# Patient Record
Sex: Male | Born: 1970 | Race: White | Hispanic: No | Marital: Married | Smoking: Former smoker
Health system: Southern US, Community
[De-identification: ages and names within clinical notes are randomized; demographics above are authoritative.]

## PROBLEM LIST (undated history)

## (undated) DIAGNOSIS — R569 Unspecified convulsions: Secondary | ICD-10-CM

## (undated) DIAGNOSIS — T7840XA Allergy, unspecified, initial encounter: Secondary | ICD-10-CM

## (undated) DIAGNOSIS — K760 Fatty (change of) liver, not elsewhere classified: Secondary | ICD-10-CM

## (undated) DIAGNOSIS — B019 Varicella without complication: Secondary | ICD-10-CM

## (undated) DIAGNOSIS — F1010 Alcohol abuse, uncomplicated: Secondary | ICD-10-CM

## (undated) DIAGNOSIS — M4802 Spinal stenosis, cervical region: Secondary | ICD-10-CM

## (undated) DIAGNOSIS — D1803 Hemangioma of intra-abdominal structures: Secondary | ICD-10-CM

## (undated) DIAGNOSIS — M4310 Spondylolisthesis, site unspecified: Secondary | ICD-10-CM

## (undated) HISTORY — DX: Alcohol abuse, uncomplicated: F10.10

## (undated) HISTORY — DX: Hemangioma of intra-abdominal structures: D18.03

## (undated) HISTORY — DX: Unspecified convulsions: R56.9

## (undated) HISTORY — DX: Fatty (change of) liver, not elsewhere classified: K76.0

## (undated) HISTORY — DX: Spinal stenosis, cervical region: M48.02

## (undated) HISTORY — DX: Spondylolisthesis, site unspecified: M43.10

## (undated) HISTORY — PX: HERNIA REPAIR: SHX51

## (undated) HISTORY — DX: Allergy, unspecified, initial encounter: T78.40XA

## (undated) HISTORY — DX: Varicella without complication: B01.9

---
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
## 2006-02-20 ENCOUNTER — Emergency Department: Payer: Self-pay | Admitting: Emergency Medicine

## 2009-03-24 ENCOUNTER — Ambulatory Visit: Payer: Self-pay | Admitting: Internal Medicine

## 2009-04-12 ENCOUNTER — Ambulatory Visit: Payer: Self-pay | Admitting: Internal Medicine

## 2009-10-13 ENCOUNTER — Ambulatory Visit: Payer: Self-pay | Admitting: Internal Medicine

## 2009-11-01 ENCOUNTER — Ambulatory Visit: Payer: Self-pay | Admitting: Internal Medicine

## 2010-03-29 ENCOUNTER — Ambulatory Visit: Payer: Self-pay | Admitting: Gastroenterology

## 2010-05-16 ENCOUNTER — Encounter: Admission: RE | Admit: 2010-05-16 | Discharge: 2010-05-16 | Payer: Self-pay | Admitting: Gastroenterology

## 2010-12-06 ENCOUNTER — Ambulatory Visit: Payer: Self-pay | Admitting: Gastroenterology

## 2011-08-11 ENCOUNTER — Emergency Department: Payer: Self-pay | Admitting: Emergency Medicine

## 2011-09-07 ENCOUNTER — Other Ambulatory Visit: Payer: Self-pay | Admitting: Unknown Physician Specialty

## 2011-09-07 DIAGNOSIS — M25511 Pain in right shoulder: Secondary | ICD-10-CM

## 2011-09-14 ENCOUNTER — Ambulatory Visit
Admission: RE | Admit: 2011-09-14 | Discharge: 2011-09-14 | Disposition: A | Discharge status: Home / Self Care | Payer: BC Managed Care – PPO | Source: Ambulatory Visit | Attending: Unknown Physician Specialty | Admitting: Unknown Physician Specialty

## 2011-09-14 DIAGNOSIS — M25511 Pain in right shoulder: Secondary | ICD-10-CM

## 2011-09-14 MED ORDER — IOHEXOL 180 MG/ML  SOLN
8.0000 mL | Freq: Once | INTRAMUSCULAR | Status: AC | PRN
  Administered 2011-09-14: 8 mL via INTRA_ARTICULAR

## 2011-10-02 HISTORY — PX: SHOULDER SURGERY: SHX246

## 2012-02-06 ENCOUNTER — Emergency Department: Payer: Self-pay | Admitting: *Deleted

## 2014-04-27 ENCOUNTER — Ambulatory Visit: Payer: Self-pay | Admitting: Surgery

## 2014-05-02 ENCOUNTER — Emergency Department: Payer: Self-pay | Admitting: Emergency Medicine

## 2014-05-02 LAB — COMPREHENSIVE METABOLIC PANEL
ALBUMIN: 4.8 g/dL (ref 3.4–5.0)
ALK PHOS: 69 U/L
ANION GAP: 7 (ref 7–16)
BUN: 9 mg/dL (ref 7–18)
Bilirubin,Total: 0.5 mg/dL (ref 0.2–1.0)
CO2: 27 mmol/L (ref 21–32)
Calcium, Total: 9.5 mg/dL (ref 8.5–10.1)
Chloride: 103 mmol/L (ref 98–107)
Creatinine: 0.86 mg/dL (ref 0.60–1.30)
GLUCOSE: 84 mg/dL (ref 65–99)
Osmolality: 272 (ref 275–301)
POTASSIUM: 3.8 mmol/L (ref 3.5–5.1)
SGOT(AST): 27 U/L (ref 15–37)
SGPT (ALT): 35 U/L
Sodium: 137 mmol/L (ref 136–145)
TOTAL PROTEIN: 9.2 g/dL — AB (ref 6.4–8.2)

## 2014-05-02 LAB — CBC WITH DIFFERENTIAL/PLATELET
BASOS PCT: 0.7 %
Basophil #: 0.1 10*3/uL (ref 0.0–0.1)
EOS ABS: 0.2 10*3/uL (ref 0.0–0.7)
EOS PCT: 1.8 %
HCT: 44.1 % (ref 40.0–52.0)
HGB: 14.8 g/dL (ref 13.0–18.0)
LYMPHS ABS: 3.1 10*3/uL (ref 1.0–3.6)
Lymphocyte %: 33.6 %
MCH: 31 pg (ref 26.0–34.0)
MCHC: 33.5 g/dL (ref 32.0–36.0)
MCV: 93 fL (ref 80–100)
MONOS PCT: 5.6 %
Monocyte #: 0.5 x10 3/mm (ref 0.2–1.0)
NEUTROS ABS: 5.3 10*3/uL (ref 1.4–6.5)
NEUTROS PCT: 58.3 %
PLATELETS: 333 10*3/uL (ref 150–440)
RBC: 4.76 10*6/uL (ref 4.40–5.90)
RDW: 12 % (ref 11.5–14.5)
WBC: 9.1 10*3/uL (ref 3.8–10.6)

## 2014-05-02 LAB — URINALYSIS, COMPLETE
BACTERIA: NONE SEEN
BILIRUBIN, UR: NEGATIVE
Blood: NEGATIVE
GLUCOSE, UR: NEGATIVE mg/dL (ref 0–75)
Ketone: NEGATIVE
LEUKOCYTE ESTERASE: NEGATIVE
Nitrite: NEGATIVE
Ph: 6 (ref 4.5–8.0)
Protein: NEGATIVE
RBC, UR: NONE SEEN /HPF (ref 0–5)
SQUAMOUS EPITHELIAL: NONE SEEN
Specific Gravity: 1.005 (ref 1.003–1.030)
WBC UR: <1 /HPF (ref 0–5)

## 2014-05-21 LAB — HM COLONOSCOPY

## 2014-06-10 DIAGNOSIS — K219 Gastro-esophageal reflux disease without esophagitis: Secondary | ICD-10-CM | POA: Insufficient documentation

## 2014-06-10 DIAGNOSIS — J309 Allergic rhinitis, unspecified: Secondary | ICD-10-CM | POA: Insufficient documentation

## 2014-06-10 DIAGNOSIS — M4802 Spinal stenosis, cervical region: Secondary | ICD-10-CM | POA: Insufficient documentation

## 2014-06-10 DIAGNOSIS — K760 Fatty (change of) liver, not elsewhere classified: Secondary | ICD-10-CM | POA: Insufficient documentation

## 2014-06-18 ENCOUNTER — Ambulatory Visit: Payer: Self-pay | Admitting: Gastroenterology

## 2014-06-22 ENCOUNTER — Ambulatory Visit: Payer: Self-pay | Admitting: Gastroenterology

## 2014-07-16 ENCOUNTER — Ambulatory Visit: Payer: Self-pay | Admitting: Urology

## 2015-01-22 NOTE — Op Note (Signed)
PATIENT NAME:  Gary Mccullough, Gary Mccullough MR#:  [id number] DATE OF BIRTH:  [birth year]  DATE OF PROCEDURE:  04/27/2014  PREOPERATIVE DIAGNOSIS: Right inguinal hernia.   POSTOPERATIVE DIAGNOSIS: Right inguinal hernia.   PROCEDURE: Right inguinal hernia repair.   SURGEON: [name], M.D.   ANESTHESIA: General.   INDICATIONS: This 44 year old male has had bulging in the right groin. A right inguinal hernia was demonstrated on physical exam and repair was recommended for definitive treatment.   DESCRIPTION OF PROCEDURE: The patient was placed on the operating table in the supine position under general anesthesia. The right lower abdomen was prepared with clippers and with ChloraPrep and draped in a sterile manner.   A right lower quadrant transversely oriented suprapubic incision was made, carried down through subcutaneous tissues. A number of small bleeding points were electrocauterized. Scarpa's fascia was incised. The external oblique aponeurosis was incised along the course of its fibers to open the external ring and expose the inguinal cord structures. The cord structures were mobilized. The floor of the inguinal canal appeared to be intact. Cremaster fibers were spread to expose an indirect hernia sac which was approximately 4 cm in length, was dissected up into the internal ring. A high ligation of the sac was done with a 4-0 Vicryl suture ligature. Two cord lipomas were also dissected free from surrounding structures and followed up into the internal ring. Each were ligated with 4-0 chromic suture ligatures and excised. No tissues were submitted for pathology.   Next, the floor of the inguinal canal was imbricated with interrupted 0 Surgilon sutures, suturing the shelving edge of the inguinal ligament to the conjoined tendon and the last stitch led to satisfactory narrowing of the internal ring. The Bard soft mesh was cut to create an oval shape of some 2.2 x 3.5 cm with a notch cut out to straddle the  internal ring. This was placed along the floor of the inguinal canal and sutured to the repair with 0 Surgilon. It was also sutured medially to the fascia with 0 Surgilon and also sutured on both sides of the internal ring. The repair looked good.   Hemostasis was intact. The cord structures were replaced along the floor of the inguinal canal. Cut edges of the external oblique aponeurosis were closed with a running 4-0 Vicryl. The deep fascia superior and lateral to the repair site was infiltrated with 0.5% Sensorcaine with epinephrine. Subcutaneous tissues were infiltrated as well. Next, the Scarpa's fascia was closed with interrupted 4-0 Monocryl. The skin was closed with running 4-0 Monocryl subcuticular suture and LiquiBand. The testicle remained in the scrotum.   The patient appeared to tolerate the procedure satisfactorily and was prepared for transfer to the recovery room.    ________________________ Lenna Sciara. [name], MD [id number] D: 04/27/2014 15:18:00 ET T: 04/27/2014 16:39:58 ET JOB#: [id number]  cc: Loreli Dollar, MD, <Dictator> Loreli Dollar MD ELECTRONICALLY SIGNED 04/28/2014 17:44

## 2015-07-26 IMAGING — CR DG ABDOMEN 3V
2 series · 4 of 4 positions shown · non-contrast
Comparison: None.

CLINICAL DATA: Constipation following a hernia repair 5 days ago.

EXAM:
ABDOMEN SERIES

[ap]
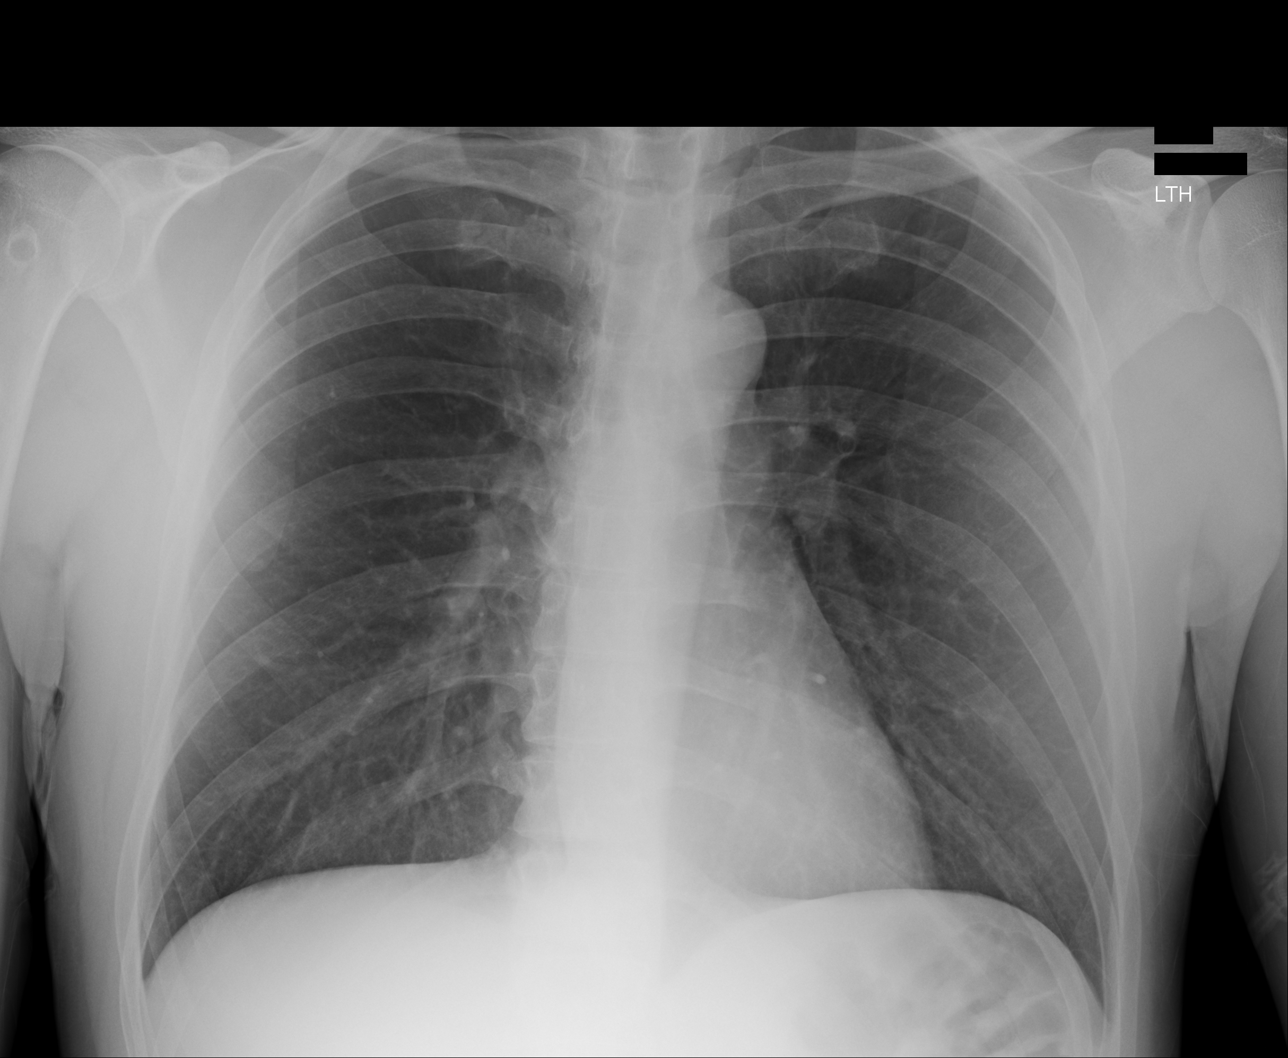

[Series 1: t abdomen supine · 0.14mm/px · 3 of 3 slices shown]
[im 1/3]
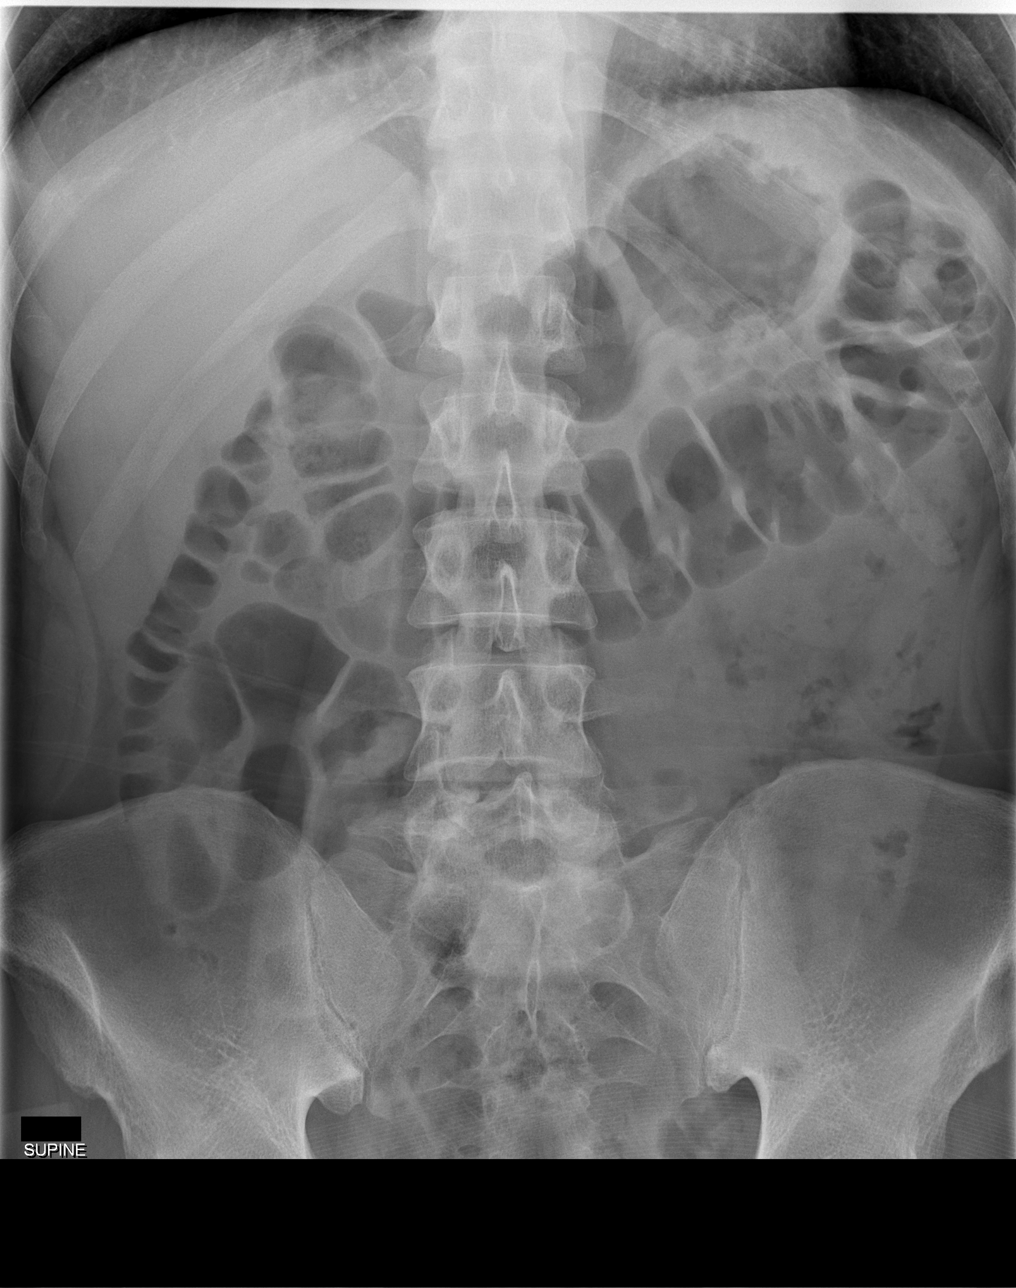
[im 2/3]
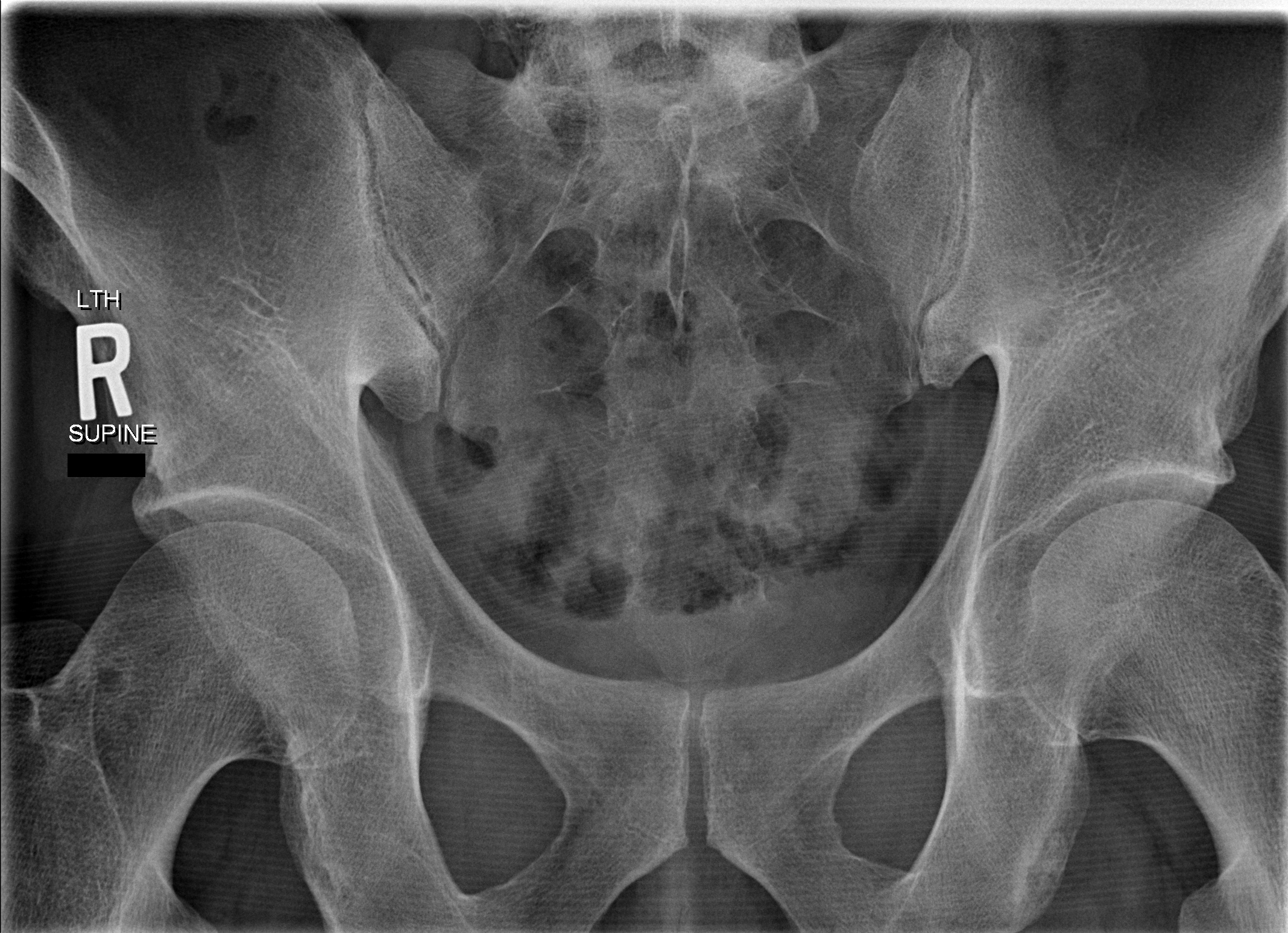
[im 3/3]
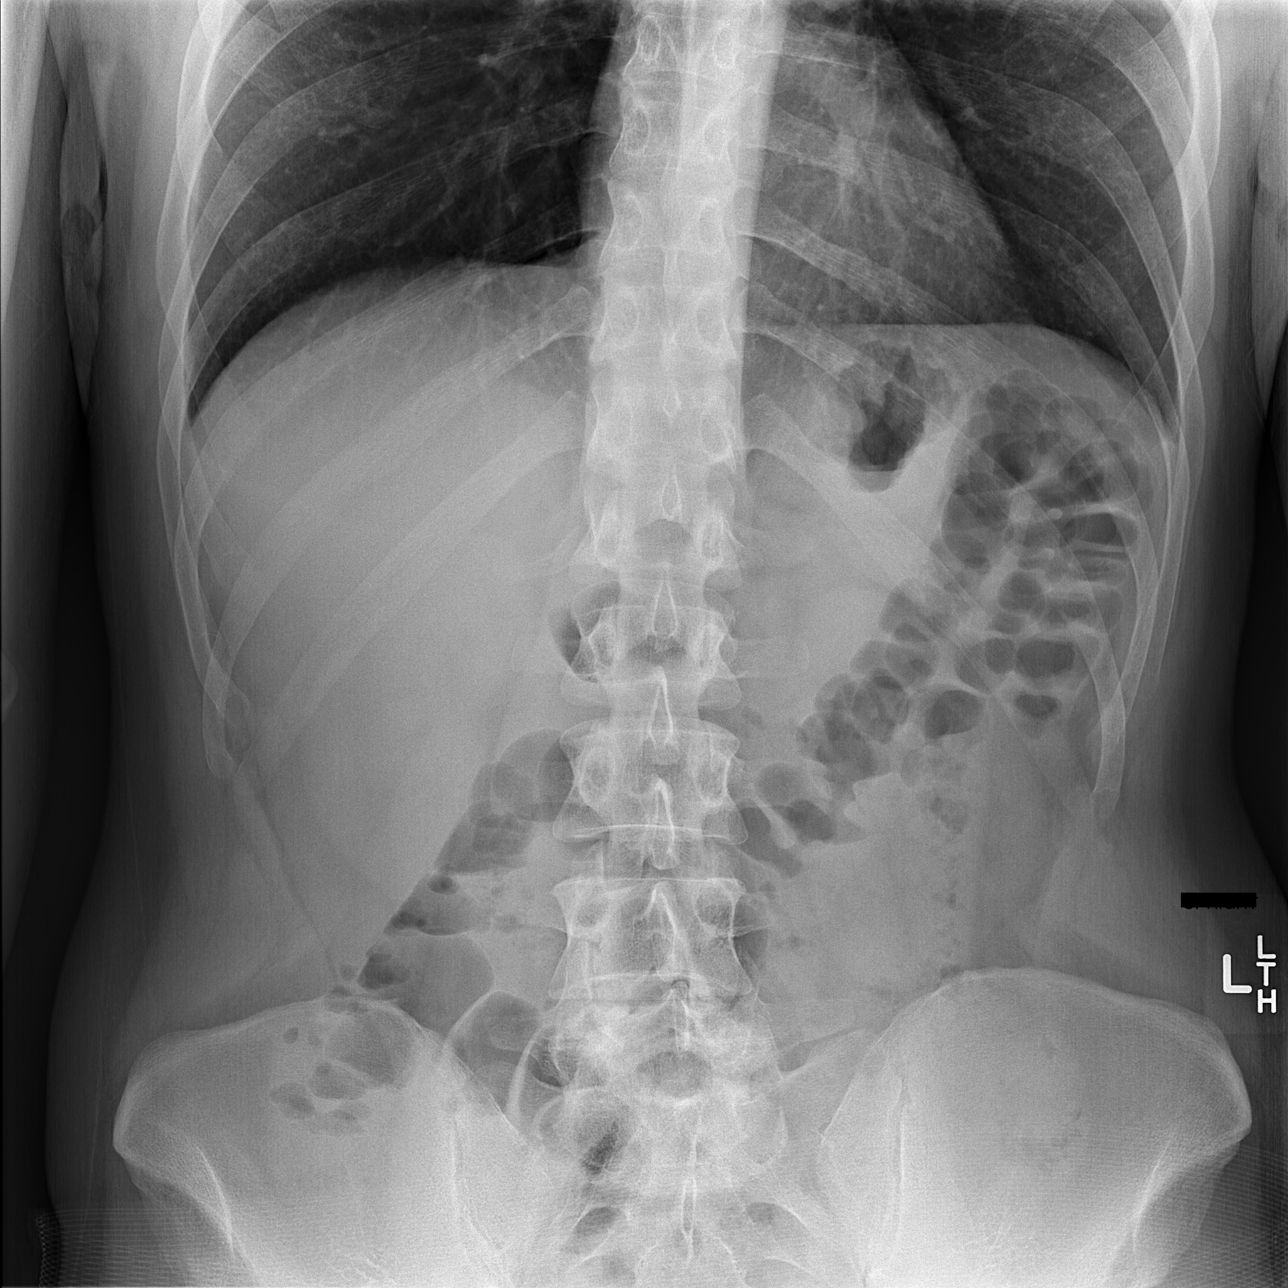

[4 of 4 positions shown; findings below may reference images not displayed]

FINDINGS: Normal sized heart. Clear lungs. Normal bowel gas pattern without
free peritoneal air. Very little stool in the colon. Unremarkable
bones.
IMPRESSION: No acute abnormality.  No significant stool.

## 2015-08-03 ENCOUNTER — Ambulatory Visit (INDEPENDENT_AMBULATORY_CARE_PROVIDER_SITE_OTHER): Payer: BLUE CROSS/BLUE SHIELD | Admitting: Internal Medicine

## 2015-08-03 ENCOUNTER — Encounter (INDEPENDENT_AMBULATORY_CARE_PROVIDER_SITE_OTHER): Payer: Self-pay

## 2015-08-03 ENCOUNTER — Encounter: Payer: Self-pay | Admitting: Internal Medicine

## 2015-08-03 VITALS — BP 116/78 | HR 80 | Temp 98.4°F | Resp 12 | Ht 75.0 in | Wt 176.1 lb

## 2015-08-03 DIAGNOSIS — Z87891 Personal history of nicotine dependence: Secondary | ICD-10-CM

## 2015-08-03 DIAGNOSIS — R1031 Right lower quadrant pain: Secondary | ICD-10-CM

## 2015-08-03 DIAGNOSIS — R1011 Right upper quadrant pain: Secondary | ICD-10-CM | POA: Diagnosis not present

## 2015-08-03 DIAGNOSIS — G8929 Other chronic pain: Secondary | ICD-10-CM | POA: Diagnosis not present

## 2015-08-03 NOTE — Patient Instructions (Addendum)
Stop the lexapro for now and continue the linzess  If the abdominal pain worsens,  We will start cymbalta   I will contact you in a few days with the results of today's labs and my plan

## 2015-08-03 NOTE — Progress Notes (Addendum)
Subjective:  Patient ID: Gary Mccullough, male    DOB: April 08, 1971  Age: 44 y.o. MRN: [id number]  CC: The primary encounter diagnosis was Abdominal pain, chronic, right lower quadrant. Diagnoses of Abdominal pain, right upper quadrant and History of tobacco abuse were also pertinent to this visit.  HPI [name] presents for new patient referred by his wife [name] for cc of abdominal pain.   Underwent  Surgery for  reducible right inguinal hernia in July 2015 by [name] . Lost 26 lbs postoperatively due to new onset right sided flank and right upper quadrant pain accompanied by constipation.  Pain was worse after eating .  Pre op wt 175 lbs   Post op nadir 159.  Was referred to GI,  Colonoscopy was done by [name].  Diagnosed with  IBS.  Then sent to Urology for evaluation of incidental finding of cyst on the kidney found,sent to Dr [name].  Complex  [place] cyst found in Sept 22 2105  ,  Repeat US scheduled for next week     For the IBS constipation was advised to take miralax and senna,  with no improvement.   Then was prescribed  lexapro 7 weeks ago.and stopped the daily miralax and senna.  Notes that the constipation improved somewhat on the lexapro, but side effects of flu like symptoms intolerable.  Was prescribed Linzess,  started last week at 145 mcg and he has had fecal urgency , liquid stools  every day except one.  Still having the right sided pressure/pain. Notes that the post prandial pain is much worse than the preprandial pain .  Has tried gluten free diet,  FODMAP diet,  no change  To pain   Has gained back 15 lbs since starting lexapro  .    No vascular evaluation as of yet for mesenteric ischemia.     X tobacco smoked for 20 yrs,  Quit 2 yrs ago;   vapes now Prior alcohol abuse,  Abstinent now . [name]      Fatty Liver per review of [facility] clinic records.    Right shoulder surgery 2013 biceps tendon and labrum repair  Occurred while Playing golf .   History of  overweight.   205 lb s , lost down to 175 lbs.    Has regained  ot 186  Lbs  Nasal fracture repair 1964from  basketball trauma   Still able to play golf.    History [name] has a past medical history of Alcohol abuse; Chicken pox; Allergy; and Seizures ([place]).   He has past surgical history that includes Fracture surgery and Hernia repair.   His family history includes CVA in his father.He reports that he quit smoking about 2 years ago. His smoking use included Cigarettes. He started smoking about 22 years ago. He smoked 1.00 pack per day. He has never used smokeless tobacco. He reports that he uses illicit drugs. He reports that he does not drink alcohol.  No outpatient prescriptions prior to visit.   No facility-administered medications prior to visit.    Review of Systems:  Patient denies headache, fevers, malaise, , skin rash, eye pain, sinus congestion and sinus pain, sore throat, dysphagia,  hemoptysis , cough, dyspnea, wheezing, chest pain, palpitations, orthopnea, edema,  nausea, melena, , dysuria, hematuria, urinary  Frequency, nocturia, numbness, tingling, seizures,  Focal weakness, Loss of consciousness,  Tremor, insomnia, depression, anxiety, and suicidal ideation.     Objective:  BP 116/78 mmHg  Pulse 80  Temp(Src) 98.4 F (36.9 C) (Oral)  Resp 12  Ht 6\' 3"  (1.905 m)  Wt 176 lb 2 oz (79.89 kg)  BMI 22.01 kg/m2  SpO2 99%  Physical Exam:  General appearance: alert, cooperative and appears stated age Ears: normal TM's and external ear canals both ears Throat: lips, mucosa, and tongue normal; teeth and gums normal Neck: no adenopathy, no carotid bruit, supple, symmetrical, trachea midline and thyroid not enlarged, symmetric, no tenderness/mass/nodules Back: symmetric, no curvature. ROM normal. No CVA tenderness. Lungs: clear to auscultation bilaterally Heart: regular rate and rhythm, S1, S2 normal, no murmur, click, rub or gallop Abdomen: soft, non-tender; bowel  sounds normal; no masses,  no organomegaly Pulses: 2+ and symmetric Skin: Skin color, texture, turgor normal. No rashes or lesions Lymph nodes: Cervical, supraclavicular, and axillary nodes normal.   Assessment & Plan:   Problem List Items Addressed This Visit    Abdominal pain, right upper quadrant    Chronic since 2015, with weight loss and constipation the accompanying symptoms  Told he has IBS but so far no improvement with multiple interventions including dietary and pharmacologic.    He wonders if the hernia surgery could have caused a stricture due to adhesions.  Explained that this diagnosis not possible without laparoscopy.  Advised to suspend lexapro for a week and use linzess only.  Should Consider vascular evaluation for chronic mesenteric ischemia given history  Of tobacco abuse, but more recent weight gain argues against this. Could also consider  Capsule colonoscopy to evaluate small intestine which will required referral,  Will order UGI/sBFT      History of tobacco abuse    Other Visit Diagnoses    Abdominal pain, chronic, right lower quadrant    -  Primary    Relevant Orders    Celiac Disease Ab Screen w/Rfx (Completed)    Lyme Aby, Wstrn. Blt. IgG & IgM w/bands (Completed)    Ehrlichia antibody panel (Completed)      A total of 45 minutes was spent with patient more than half of which was spent in counseling patient on the above mentioned issues , reviewing and explaining recent labs and imaging studies done, and coordination of care. I am having Mr. [name] maintain his escitalopram, LINZESS, MULTI-VITAMINS, Fish Oil, TURMERIC PO, and cetirizine.  Meds ordered this encounter  Medications  . escitalopram (LEXAPRO) 10 MG tablet    Sig: Take 5 mg by mouth daily.  [name] LINZESS 145 MCG CAPS capsule    Sig: Take 1 capsule by mouth daily.    Refill:  0  . Multiple Vitamin (MULTI-VITAMINS) TABS    Sig: Take by mouth.  . Omega-3 Fatty Acids (FISH OIL) 1000 MG CAPS    Sig:  Take by mouth.  . TURMERIC PO    Sig: Take 2 capsules by mouth daily.  . cetirizine (ZYRTEC) 10 MG tablet    Sig: Take 10 mg by mouth daily.    There are no discontinued medications.  Follow-up: No Follow-up on file.   [name], MD

## 2015-08-03 NOTE — Progress Notes (Signed)
Pre-visit discussion using our clinic review tool. No additional management support is needed unless otherwise documented below in the visit note.  

## 2015-08-04 LAB — CELIAC DISEASE AB SCREEN W/RFX
ANTIGLIADIN ABS, IGA: 3 U (ref 0–19)
IgA/Immunoglobulin A, Serum: 176 mg/dL (ref 90–386)

## 2015-08-05 LAB — LYME ABY, WSTRN BLT IGG & IGM W/BANDS
B BURGDORFERI IGG ABS (IB): NEGATIVE
B burgdorferi IgM Abs (IB): NEGATIVE
LYME DISEASE 23 KD IGG: NONREACTIVE
LYME DISEASE 23 KD IGM: NONREACTIVE
LYME DISEASE 28 KD IGG: NONREACTIVE
LYME DISEASE 39 KD IGG: REACTIVE — AB
LYME DISEASE 41 KD IGG: NONREACTIVE
LYME DISEASE 45 KD IGG: NONREACTIVE
LYME DISEASE 66 KD IGG: NONREACTIVE
LYME DISEASE 93 KD IGG: NONREACTIVE
Lyme Disease 18 kD IgG: NONREACTIVE
Lyme Disease 30 kD IgG: NONREACTIVE
Lyme Disease 39 kD IgM: NONREACTIVE
Lyme Disease 41 kD IgM: NONREACTIVE
Lyme Disease 58 kD IgG: NONREACTIVE

## 2015-08-06 DIAGNOSIS — Z87891 Personal history of nicotine dependence: Secondary | ICD-10-CM | POA: Insufficient documentation

## 2015-08-06 DIAGNOSIS — R109 Unspecified abdominal pain: Secondary | ICD-10-CM | POA: Insufficient documentation

## 2015-08-06 LAB — EHRLICHIA ANTIBODY PANEL
E chaffeensis (HGE) Ab, IgG: <1:64 {titer}
E chaffeensis (HGE) Ab, IgM: <1:20 {titer}

## 2015-08-06 NOTE — Assessment & Plan Note (Addendum)
Chronic since 2015, with weight loss and constipation the accompanying symptoms  Told he has IBS but so far no improvement with multiple interventions including dietary and pharmacologic.    He wonders if the hernia surgery could have caused a stricture due to adhesions.  Explained that this diagnosis not possible without laparoscopy.  Advised to suspend lexapro for a week and use linzess only.  Should Consider vascular evaluation for chronic mesenteric ischemia given history  Of tobacco abuse, but more recent weight gain argues against this. Could also consider  Capsule colonoscopy to evaluate small intestine which will required referral,  Will order UGI/sBFT

## 2015-08-07 ENCOUNTER — Encounter: Payer: Self-pay | Admitting: Internal Medicine

## 2015-08-16 ENCOUNTER — Encounter: Payer: Self-pay | Admitting: Internal Medicine

## 2015-08-16 DIAGNOSIS — R1013 Epigastric pain: Secondary | ICD-10-CM

## 2015-08-17 MED ORDER — LINACLOTIDE 145 MCG PO CAPS: 145.0000 ug | ORAL_CAPSULE | Freq: Every day | ORAL | Status: DC

## 2015-09-07 ENCOUNTER — Ambulatory Visit
Admission: RE | Admit: 2015-09-07 | Discharge: 2015-09-07 | Disposition: A | Discharge status: Home / Self Care | Payer: BLUE CROSS/BLUE SHIELD | Source: Ambulatory Visit | Attending: Internal Medicine | Admitting: Internal Medicine

## 2015-09-07 DIAGNOSIS — R1013 Epigastric pain: Secondary | ICD-10-CM

## 2015-09-07 DIAGNOSIS — K5710 Diverticulosis of small intestine without perforation or abscess without bleeding: Secondary | ICD-10-CM | POA: Diagnosis not present

## 2015-09-09 ENCOUNTER — Encounter: Payer: Self-pay | Admitting: Internal Medicine

## 2015-09-13 ENCOUNTER — Encounter: Payer: Self-pay | Admitting: Internal Medicine

## 2015-09-13 ENCOUNTER — Other Ambulatory Visit: Payer: Self-pay | Admitting: Internal Medicine

## 2015-09-13 ENCOUNTER — Telehealth: Payer: Self-pay | Admitting: General Surgery

## 2015-09-13 DIAGNOSIS — R1011 Right upper quadrant pain: Secondary | ICD-10-CM

## 2015-09-13 DIAGNOSIS — R1031 Right lower quadrant pain: Secondary | ICD-10-CM

## 2015-09-13 NOTE — Telephone Encounter (Signed)
I have called patient to make an appointment per referral. Patient's wife answer and stated that she will have the patient call the office to make appointment. Patient had an inguinal hernia repair done by Dr [name] last year and would like to see another surgeon for a second opinion.

## 2015-09-14 ENCOUNTER — Encounter: Payer: Self-pay | Admitting: Internal Medicine

## 2015-09-16 ENCOUNTER — Ambulatory Visit (INDEPENDENT_AMBULATORY_CARE_PROVIDER_SITE_OTHER): Payer: BLUE CROSS/BLUE SHIELD | Admitting: Surgery

## 2015-09-16 ENCOUNTER — Encounter: Payer: Self-pay | Admitting: Surgery

## 2015-09-16 VITALS — BP 123/72 | HR 73 | Temp 97.8°F | Ht 75.0 in | Wt 180.6 lb

## 2015-09-16 DIAGNOSIS — R109 Unspecified abdominal pain: Secondary | ICD-10-CM | POA: Diagnosis not present

## 2015-09-16 NOTE — Progress Notes (Signed)
Surgical Consultation  09/16/2015  Gary Mccullough is an 44 y.o. male.   CC: Right abd pain  HPI: This patient with right flank pain since a right inguinal hernia repair by Dr. Tamala Julian last year. He describes right abdominal pain that started one day after surgery 18 months ago. He states he sometimes has trouble having bowel movements as trouble sleeping on his side he denies melena or hematochezia and has had a recent negative colonoscopy(September 2015). Patient states that he had the pain almost immediately after surgery not in the PACU but at home the next day. He is a Air cabin crew and it has not affected his golf swing nor prevented him from playing golf is not affected his work. He seems to be very frustrated with [name]. He denies any flank pain but that was his entered chief complaint in the computer prior to his arrival.  Past Medical History  Diagnosis Date  . Alcohol abuse   . Chicken pox   . Allergy   . Seizures [name])     Past Surgical History  Procedure Laterality Date  . Fracture surgery    . Hernia repair      Family History  Problem Relation Age of Onset  . CVA Father     Social History:  reports that he quit smoking about 2 years ago. His smoking use included Cigarettes. He started smoking about 22 years ago. He smoked 1.00 pack per day. He has never used smokeless tobacco. He reports that he uses illicit drugs. He reports that he does not drink alcohol.  Allergies: No Known Allergies  Medications reviewed.   Review of Systems:   Review of Systems  Constitutional: Negative.   HENT: Negative.   Eyes: Negative.   Respiratory: Negative.   Cardiovascular: Negative.   Gastrointestinal: Positive for abdominal pain and constipation. Negative for heartburn, nausea, vomiting, diarrhea, blood in stool and melena.       Right sided abdominal pain patient points to the right side of the umbilicus.  Genitourinary: Negative.  Negative for dysuria, urgency and frequency.   Musculoskeletal: Negative.   Skin: Negative.   Neurological: Negative.   Endo/Heme/Allergies: Negative.   Psychiatric/Behavioral: Negative.      Physical Exam:  BP 123/72 mmHg  Pulse 73  Temp(Src) 97.8 F (36.6 C) (Oral)  Ht 6\' 3"  (1.905 m)  Wt 180 lb 9.6 oz (81.92 kg)  BMI 22.57 kg/m2  Physical Exam  Constitutional: He is oriented to person, place, and time and well-developed, well-nourished, and in no distress. No distress.  HENT:  Head: Normocephalic and atraumatic.  Eyes: Pupils are equal, round, and reactive to light. Right eye exhibits no discharge. Left eye exhibits no discharge. No scleral icterus.  Neck: Normal range of motion.  Cardiovascular: Normal rate and regular rhythm.   Pulmonary/Chest: Effort normal and breath sounds normal. No respiratory distress. He has no wheezes. He has no rales.  Abdominal: Soft. Bowel sounds are normal. He exhibits no distension and no mass. There is no tenderness. There is no rebound and no guarding.  Completely nontender abdomen  Genitourinary: Penis normal.  Patient examined standing supine and with Valsalva No sign of recurrent hernia on the right no sign of hernia on the left and no tenderness of the internal ring. Are as healed well  Musculoskeletal: Normal range of motion. He exhibits no edema.  Lymphadenopathy:    He has no cervical adenopathy.  Neurological: He is alert and oriented to person, place, and time.  Skin:  Skin is warm and dry. He is not diaphoretic. No erythema.  Psychiatric: Mood and affect normal.      No results found for this or any previous visit (from the past 48 hour(s)). No results found.  Assessment/Plan:  This is a patient with right abd pain following a right inguinal herniorrhaphy by [name]. This was performed last year. And it was an open repair with mesh. He has had a negative MRI of the abdomen. I see no clinical findings to explain the cause of his pain or his bowel habit  changes. He has had a colonoscopy and an MR of the abdomen both of which were negative. I do not believe that a laparoscopy would identify the cause of this especially since it started long before scar tissue would have developed. Personal review of [name]'s operative report was performed as well. I see no clear etiology to this and this may be related to his back pain which is chronic in nature and he states that he may need surgery for it in the future. With the patient's level of frustration and the fact that he has not had a study in 15 months I will repeat a CT scan of the abdomen and pelvis are that there is no obvious abnormality that would be treatable surgically. He has an appointment with his neurosurgeon for his low back problem and I suggested that he ask him that question as well. He has had no studies on his back in 2 years. Is may be the source of this pain but temporally I cannot relate it to the inguinal hernia repair. We'll call him with results of the CT scan which I expect will be negative.  Florene Glen, MD, FACS

## 2015-09-16 NOTE — Patient Instructions (Addendum)
We have ordered a CT scan of your Abdomen and Pelvis. Central scheduling will call you to get this scheduled. If you have not heard from them in 3 business days, please call our office.  If your CT is negative, we will send you to a pain specialist that can look further into your back problems.  Please call our office with any questions.

## 2015-09-20 ENCOUNTER — Other Ambulatory Visit: Payer: Self-pay

## 2015-09-20 DIAGNOSIS — R109 Unspecified abdominal pain: Secondary | ICD-10-CM

## 2015-09-29 ENCOUNTER — Ambulatory Visit
Admission: RE | Admit: 2015-09-29 | Discharge: 2015-09-29 | Disposition: A | Discharge status: Home / Self Care | Payer: BLUE CROSS/BLUE SHIELD | Source: Ambulatory Visit | Attending: Surgery | Admitting: Surgery

## 2015-09-29 DIAGNOSIS — R109 Unspecified abdominal pain: Secondary | ICD-10-CM | POA: Diagnosis not present

## 2015-09-29 MED ORDER — IOHEXOL 350 MG/ML SOLN
100.0000 mL | Freq: Once | INTRAVENOUS | Status: AC | PRN
  Administered 2015-09-29: 100 mL via INTRAVENOUS

## 2015-10-04 ENCOUNTER — Encounter: Payer: Self-pay | Admitting: Internal Medicine

## 2015-10-04 ENCOUNTER — Telehealth: Payer: Self-pay

## 2015-10-04 MED ORDER — PEG 3350-KCL-NABCB-NACL-NASULF 236 G PO SOLR: ORAL | Status: DC

## 2015-10-04 NOTE — Telephone Encounter (Signed)
Called patient at this time to give CT results. Patient is unavailable but results were given to patients wife. Explained that he does have a moderate Stool burden and that he can take Miralax 17g daily to see if this will help with pain. Otherwise, scan is normal.   Patient's wife states, "This is not what we wanted to hear but I'm glad that nothing is wrong at the same time." She also informed me that they have scheduled an appointment with patient's neurosurgeon to see if this could possible be due to nerve pain in the back.  Encouraged to call back with any further questions or concerns.

## 2015-10-21 ENCOUNTER — Encounter: Payer: Self-pay | Admitting: Internal Medicine

## 2015-12-13 ENCOUNTER — Encounter: Payer: Self-pay | Admitting: Internal Medicine

## 2015-12-18 ENCOUNTER — Other Ambulatory Visit: Payer: Self-pay | Admitting: Internal Medicine

## 2015-12-18 MED ORDER — LACTULOSE 20 GM/30ML PO SOLN: ORAL | Status: DC

## 2015-12-19 ENCOUNTER — Other Ambulatory Visit: Payer: Self-pay | Admitting: Family Medicine

## 2015-12-19 MED ORDER — OSELTAMIVIR PHOSPHATE 75 MG PO CAPS: 75.0000 mg | ORAL_CAPSULE | Freq: Every day | ORAL | Status: DC

## 2016-01-25 ENCOUNTER — Encounter: Payer: Self-pay | Admitting: Internal Medicine

## 2016-01-25 ENCOUNTER — Ambulatory Visit (INDEPENDENT_AMBULATORY_CARE_PROVIDER_SITE_OTHER): Payer: BLUE CROSS/BLUE SHIELD | Admitting: Internal Medicine

## 2016-01-25 VITALS — BP 108/78 | HR 66 | Temp 97.8°F | Resp 12 | Ht 75.0 in | Wt 176.2 lb

## 2016-01-25 DIAGNOSIS — E559 Vitamin D deficiency, unspecified: Secondary | ICD-10-CM | POA: Diagnosis not present

## 2016-01-25 DIAGNOSIS — R7301 Impaired fasting glucose: Secondary | ICD-10-CM

## 2016-01-25 DIAGNOSIS — R109 Unspecified abdominal pain: Secondary | ICD-10-CM

## 2016-01-25 DIAGNOSIS — R1031 Right lower quadrant pain: Secondary | ICD-10-CM | POA: Diagnosis not present

## 2016-01-25 DIAGNOSIS — K5909 Other constipation: Secondary | ICD-10-CM

## 2016-01-25 DIAGNOSIS — R634 Abnormal weight loss: Secondary | ICD-10-CM

## 2016-01-25 MED ORDER — SERTRALINE HCL 50 MG PO TABS: 50.0000 mg | ORAL_TABLET | Freq: Every day | ORAL | Status: DC

## 2016-01-25 MED ORDER — OMEPRAZOLE 40 MG PO CPDR: 40.0000 mg | DELAYED_RELEASE_CAPSULE | Freq: Every day | ORAL | Status: DC

## 2016-01-25 MED ORDER — DIAZEPAM 5 MG PO TABS: 5.0000 mg | ORAL_TABLET | Freq: Two times a day (BID) | ORAL | Status: DC | PRN

## 2016-01-25 NOTE — Progress Notes (Signed)
Pre-visit discussion using our clinic review tool. No additional management support is needed unless otherwise documented below in the visit note.  

## 2016-01-25 NOTE — Patient Instructions (Addendum)
Suspend daily miralax and the daily stool softener  Resume linzess daily   Unless liquid stools occur  Start generic zoloft with 1/2 tablet daily for at least 4 days,  increase to 1 tablet if tolerating the 1/2 tablet   Trial of valium 1/2 to 1 tablet every 12 hours as needed for insomnia,  or increased stress .

## 2016-01-25 NOTE — Progress Notes (Signed)
Subjective:  Patient ID: Gary Mccullough, male    DOB: [birth year]  Age: 45 y.o. MRN: [id number]  CC: The primary encounter diagnosis was Loss of weight. Diagnoses of Right lower quadrant abdominal pain, Vitamin D deficiency, Impaired fasting glucose, Functional abdominal pain syndrome, and Other constipation were also pertinent to this visit.  HPI Gary Mccullough presents for follow up on chronic RLQ pain which started after his hernia repair.  Seen previously in  November 2016 by referral from wife for recurrent RLQ pain and unintentional weight loss.     Since his first visit he has had a CT abd and pelvis in December, ordered by Dr Copper during surgical evaluation.  Moderate stool noted,  No hernia   He has been moving bowels daily but only with the use of stimulant laxatives  Has episodes of extreme pain even when moving his bowels every day .   [name] Using stool softener, miralax daily,  And lactulose very 3 days, has a lot of excessive gas when he uses the lactulose.  Prior trial of Llinzess caused liquid stools.    Return of GERD .  Occurs after eating  30 minutes to an hour.    Outpatient Prescriptions Prior to Visit  Medication Sig Dispense Refill  . cetirizine (ZYRTEC) 10 MG tablet Take 10 mg by mouth daily as needed.     . Lactulose 20 GM/30ML SOLN 30 ml every 4 hours until constipation is relieved. 236 mL 3  . Linaclotide (LINZESS) 145 MCG CAPS capsule Take 1 capsule (145 mcg total) by mouth daily. 30 capsule 4  . Multiple Vitamin (MULTI-VITAMINS) TABS Take by mouth.    . Omega-3 Fatty Acids (FISH OIL) 1000 MG CAPS Take by mouth.    . TURMERIC PO Take 2 capsules by mouth daily.    [name] oseltamivir (TAMIFLU) 75 MG capsule Take 1 capsule (75 mg total) by mouth daily. (Patient not taking: Reported on 01/25/2016) 10 capsule 0  . polyethylene glycol (GOLYTELY) 236 g solution Drink one 8 ounce glass every 30 minutes until constipation has resolved 4000 mL 0   No  facility-administered medications prior to visit.    Review of Systems;  Patient denies headache, fevers, malaise, unintentional weight loss, skin rash, eye pain, sinus congestion and sinus pain, sore throat, dysphagia,  hemoptysis , cough, dyspnea, wheezing, chest pain, palpitations, orthopnea, edema, abdominal pain, nausea, melena, diarrhea, constipation, flank pain, dysuria, hematuria, urinary  Frequency, nocturia, numbness, tingling, seizures,  Focal weakness, Loss of consciousness,  Tremor, insomnia, depression, anxiety, and suicidal ideation.      Objective:  BP 108/78 mmHg  Pulse 66  Temp(Src) 97.8 F (36.6 C) (Oral)  Resp 12  Ht 6\' 3"  (1.905 m)  Wt 176 lb 4 oz (79.946 kg)  BMI 22.03 kg/m2  SpO2 98%  BP Readings from Last 3 Encounters:  01/25/16 108/78  09/16/15 123/72  08/03/15 116/78    Wt Readings from Last 3 Encounters:  01/25/16 176 lb 4 oz (79.946 kg)  09/16/15 180 lb 9.6 oz (81.92 kg)  08/03/15 176 lb 2 oz (79.89 kg)    General appearance: alert, cooperative and appears stated age Ears: normal TM's and external ear canals both ears Throat: lips, mucosa, and tongue normal; teeth and gums normal Neck: no adenopathy, no carotid bruit, supple, symmetrical, trachea midline and thyroid not enlarged, symmetric, no tenderness/mass/nodules Back: symmetric, no curvature. ROM normal. No CVA tenderness. Lungs: clear to auscultation bilaterally Heart: regular rate and rhythm, S1, S2  normal, no murmur, click, rub or gallop Abdomen: soft, non-tender; bowel sounds normal; no masses,  no organomegaly Pulses: 2+ and symmetric Skin: Skin color, texture, turgor normal. No rashes or lesions Lymph nodes: Cervical, supraclavicular, and axillary nodes normal.  Lab Results  Component Value Date   HGBA1C 5.4 01/25/2016    Lab Results  Component Value Date   CREATININE 0.77 01/25/2016   CREATININE 0.86 05/02/2014    Lab Results  Component Value Date   WBC 5.4 01/25/2016    HGB 13.4 01/25/2016   HCT 39.5 01/25/2016   PLT 294.0 01/25/2016   GLUCOSE 83 01/25/2016   CHOL 165 01/25/2016   TRIG 88.0 01/25/2016   HDL 47.10 01/25/2016   LDLCALC 100* 01/25/2016   ALT 22 01/25/2016   AST 18 01/25/2016   NA 140 01/25/2016   K 4.0 01/25/2016   CL 104 01/25/2016   CREATININE 0.77 01/25/2016   BUN 12 01/25/2016   CO2 30 01/25/2016   TSH 1.52 01/25/2016   HGBA1C 5.4 01/25/2016    Ct Abdomen Pelvis W Contrast  09/29/2015  CLINICAL DATA:  Right periumbilical area pain. Prior right inguinal hernia repair. EXAM: CT ABDOMEN AND PELVIS WITH CONTRAST TECHNIQUE: Multidetector CT imaging of the abdomen and pelvis was performed using the standard protocol following bolus administration of intravenous contrast. CONTRAST:  150mL OMNIPAQUE IOHEXOL 350 MG/ML SOLN COMPARISON:  06/22/2014 FINDINGS: Lung bases are clear.  No effusions.  Heart is normal size. Liver, gallbladder, spleen, pancreas, adrenals kidneys unremarkable. Small exophytic cyst off the upper pole of the left kidney which is stable. No hydronephrosis. Moderate stool burden throughout the colon. Normal appendix. The stomach and small bowel are decompressed and unremarkable. Aorta is normal caliber. No evidence of inguinal or umbilical hernia. No abdominal wall abnormality. IMPRESSION: Moderate stool burden in the colon. No acute findings in the abdomen or pelvis. Electronically Signed   By: Rolm Baptise M.D.   On: 09/29/2015 11:00    Assessment & Plan:   Problem List Items Addressed This Visit    Functional abdominal pain syndrome    Presumed, given the lack of objective findings repeatedly .  Trial of zoloft.       Constipation    Repeat trial of llinzes while stopping miralax and other laxatives.        Other Visit Diagnoses    Loss of weight    -  Primary    Relevant Orders    Comprehensive metabolic panel (Completed)    TSH (Completed)    CBC with Differential/Platelet (Completed)    Lipid panel  (Completed)    Right lower quadrant abdominal pain        Vitamin D deficiency        Relevant Orders    VITAMIN D 25 Hydroxy (Vit-D Deficiency, Fractures) (Completed)    Impaired fasting glucose        Relevant Orders    Hemoglobin A1c (Completed)      A total of 25 minutes of face to face time was spent with patient more than half of which was spent in counselling about the above mentioned conditions  and coordination of care   I am having Mr. Pyon start on sertraline, diazepam, and omeprazole. I am also having him maintain his MULTI-VITAMINS, Fish Oil, TURMERIC PO, cetirizine, linaclotide, Lactulose, oseltamivir, polyethylene glycol, and docusate sodium.  Meds ordered this encounter  Medications  . polyethylene glycol (MIRALAX / GLYCOLAX) packet    Sig: Take 17 g by mouth  daily.  . docusate sodium (COLACE) 100 MG capsule    Sig: Take 200 mg by mouth daily.  . sertraline (ZOLOFT) 50 MG tablet    Sig: Take 1 tablet (50 mg total) by mouth daily.    Dispense:  90 tablet    Refill:  3  . diazepam (VALIUM) 5 MG tablet    Sig: Take 1 tablet (5 mg total) by mouth every 12 (twelve) hours as needed for anxiety.    Dispense:  60 tablet    Refill:  1  . omeprazole (PRILOSEC) 40 MG capsule    Sig: Take 1 capsule (40 mg total) by mouth daily.    Dispense:  30 capsule    Refill:  3    Medications Discontinued During This Encounter  Medication Reason  . polyethylene glycol (GOLYTELY) 236 g solution Error    Follow-up: No Follow-up on file.   [name], MD

## 2016-01-26 LAB — CBC WITH DIFFERENTIAL/PLATELET
BASOS ABS: 0 10*3/uL (ref 0.0–0.1)
BASOS PCT: 0.5 % (ref 0.0–3.0)
EOS ABS: 0.2 10*3/uL (ref 0.0–0.7)
EOS PCT: 4.1 % (ref 0.0–5.0)
HCT: 39.5 % (ref 39.0–52.0)
Hemoglobin: 13.4 g/dL (ref 13.0–17.0)
LYMPHS PCT: 40.7 % (ref 12.0–46.0)
Lymphs Abs: 2.2 10*3/uL (ref 0.7–4.0)
MCHC: 34 g/dL (ref 30.0–36.0)
MCV: 91.5 fl (ref 78.0–100.0)
MONOS PCT: 7.3 % (ref 3.0–12.0)
Monocytes Absolute: 0.4 10*3/uL (ref 0.1–1.0)
NEUTROS PCT: 47.4 % (ref 43.0–77.0)
Neutro Abs: 2.6 10*3/uL (ref 1.4–7.7)
PLATELETS: 294 10*3/uL (ref 150.0–400.0)
RBC: 4.32 Mil/uL (ref 4.22–5.81)
RDW: 12.8 % (ref 11.5–15.5)
WBC: 5.4 10*3/uL (ref 4.0–10.5)

## 2016-01-26 LAB — COMPREHENSIVE METABOLIC PANEL
ALBUMIN: 4.9 g/dL (ref 3.5–5.2)
ALK PHOS: 57 U/L (ref 39–117)
ALT: 22 U/L (ref 0–53)
AST: 18 U/L (ref 0–37)
BUN: 12 mg/dL (ref 6–23)
CO2: 30 mEq/L (ref 19–32)
Calcium: 9.9 mg/dL (ref 8.4–10.5)
Chloride: 104 mEq/L (ref 96–112)
Creatinine, Ser: 0.77 mg/dL (ref 0.40–1.50)
GFR: 116.95 mL/min (ref >60.00)
Glucose, Bld: 83 mg/dL (ref 70–99)
POTASSIUM: 4 meq/L (ref 3.5–5.1)
Sodium: 140 mEq/L (ref 135–145)
TOTAL PROTEIN: 7.7 g/dL (ref 6.0–8.3)
Total Bilirubin: 0.5 mg/dL (ref 0.2–1.2)

## 2016-01-26 LAB — LIPID PANEL
Cholesterol: 165 mg/dL (ref 0–200)
HDL: 47.1 mg/dL (ref >39.00)
LDL Cholesterol: 100 mg/dL — ABNORMAL HIGH (ref 0–99)
NONHDL: 117.85
TRIGLYCERIDES: 88 mg/dL (ref 0.0–149.0)
Total CHOL/HDL Ratio: 4
VLDL: 17.6 mg/dL (ref 0.0–40.0)

## 2016-01-26 LAB — HEMOGLOBIN A1C: Hgb A1c MFr Bld: 5.4 % (ref 4.6–6.5)

## 2016-01-26 LAB — VITAMIN D 25 HYDROXY (VIT D DEFICIENCY, FRACTURES): VITD: 32.25 ng/mL (ref 30.00–100.00)

## 2016-01-26 LAB — TSH: TSH: 1.52 u[IU]/mL (ref 0.35–4.50)

## 2016-01-27 DIAGNOSIS — K589 Irritable bowel syndrome without diarrhea: Secondary | ICD-10-CM | POA: Insufficient documentation

## 2016-01-27 NOTE — Assessment & Plan Note (Addendum)
Presumed, given the lack of objective findings repeatedly .  Trial of zoloft.

## 2016-01-27 NOTE — Assessment & Plan Note (Signed)
Repeat trial of llinzes while stopping miralax and other laxatives.

## 2016-01-29 ENCOUNTER — Encounter: Payer: Self-pay | Admitting: Internal Medicine

## 2016-05-07 ENCOUNTER — Ambulatory Visit: Payer: BLUE CROSS/BLUE SHIELD | Admitting: Internal Medicine

## 2016-05-10 ENCOUNTER — Encounter: Payer: Self-pay | Admitting: Internal Medicine

## 2016-05-10 ENCOUNTER — Ambulatory Visit (INDEPENDENT_AMBULATORY_CARE_PROVIDER_SITE_OTHER): Payer: BLUE CROSS/BLUE SHIELD | Admitting: Internal Medicine

## 2016-05-10 DIAGNOSIS — R109 Unspecified abdominal pain: Secondary | ICD-10-CM

## 2016-05-10 DIAGNOSIS — Z8659 Personal history of other mental and behavioral disorders: Secondary | ICD-10-CM | POA: Diagnosis not present

## 2016-05-10 MED ORDER — LINACLOTIDE 72 MCG PO CAPS: 72.0000 ug | ORAL_CAPSULE | Freq: Every day | ORAL | 5 refills | Status: DC

## 2016-05-10 MED ORDER — BUSPIRONE HCL 10 MG PO TABS: 10.0000 mg | ORAL_TABLET | Freq: Two times a day (BID) | ORAL | 2 refills | Status: DC

## 2016-05-10 MED ORDER — SERTRALINE HCL 100 MG PO TABS: 100.0000 mg | ORAL_TABLET | Freq: Every day | ORAL | 2 refills | Status: DC

## 2016-05-10 NOTE — Progress Notes (Signed)
Subjective:  Patient ID: Gary Mccullough, male    DOB: 1970-11-08  Age: 45 y.o. MRN: [id number]  CC: Diagnoses of Functional abdominal pain syndrome and History of drug withdrawal syndrome were pertinent to this visit.  HPI Gary Mccullough presents for follow up on chronic constipation, RLQ pain and generalized anxiety .  April 24 visit: Zoloft trial started.  Repeat trial of Linzess,  Stopping all diily laxatives.  The RLQ pain is somewhat better  And he has modified his dose of  Linzess to 145 mg every other day because of loose stools.  He is eating a healthy diet and using a protein powder in a 1,000 calorie shake  to regain weight he has lost over the past 2 years.  He has gained 6 lbs.   He is having excessive gas and has been taking simethicone tablets.    He has increased his dose of zoloft to 75 ng daily but continues to feel anxious and has been using valium 5 mg before bed for insomnia ,  Occasionally increasing the dose to 7. 5 mg .   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     Outpatient Medications Prior to Visit  Medication Sig Dispense Refill  . cetirizine (ZYRTEC) 10 MG tablet Take 10 mg by mouth daily as needed.     . diazepam (VALIUM) 5 MG tablet Take 1 tablet (5 mg total) by mouth every 12 (twelve) hours as needed for anxiety. 60 tablet 1  . Multiple Vitamin (MULTI-VITAMINS) TABS Take by mouth.    . Omega-3 Fatty Acids (FISH OIL) 1000 MG CAPS Take by mouth.    . TURMERIC PO Take 2 capsules by mouth daily.    . Linaclotide (LINZESS) 145 MCG CAPS capsule Take 1 capsule (145 mcg total) by mouth daily. 30 capsule 4  . sertraline (ZOLOFT) 50 MG tablet Take 1 tablet (50 mg total) by mouth daily. 90 tablet 3  . docusate sodium (COLACE) 100 MG capsule Take 200 mg by mouth daily.    . Lactulose 20 GM/30ML SOLN 30 ml every 4 hours until constipation is relieved. 236 mL 3  . omeprazole  (PRILOSEC) 40 MG capsule Take 1 capsule (40 mg total) by mouth daily. 30 capsule 3  . oseltamivir (TAMIFLU) 75 MG capsule Take 1 capsule (75 mg total) by mouth daily. (Patient not taking: Reported on 01/25/2016) 10 capsule 0  . polyethylene glycol (MIRALAX / GLYCOLAX) packet Take 17 g by mouth daily.     No facility-administered medications prior to visit.     Review of Systems;  Patient denies headache, fevers, malaise, unintentional weight loss, skin rash, eye pain, sinus congestion and sinus pain, sore throat, dysphagia,  hemoptysis , cough, dyspnea, wheezing, chest pain, palpitations, orthopnea, edema, abdominal pain, nausea, melena, diarrhea, constipation, flank pain, dysuria, hematuria, urinary  Frequency, nocturia, numbness, tingling, seizures,  Focal weakness, Loss of consciousness,  Tremor, insomnia, depression, anxiety, and suicidal ideation.      Objective:  BP 110/72   Pulse 65   Temp 97.8 F (36.6 C)   Resp 20   Wt 182 lb 8 oz (82.8 kg)   SpO2 97%   BMI 22.81 kg/m   BP Readings from Last 3 Encounters:  05/10/16 110/72  01/25/16 108/78  09/16/15 123/72    Wt Readings from Last 3 Encounters:  05/10/16 182 lb 8 oz (82.8 kg)  01/25/16  176 lb 4 oz (79.9 kg)  09/16/15 180 lb 9.6 oz (81.9 kg)    General appearance: alert, cooperative and appears stated age Ears: normal TM's and external ear canals both ears Throat: lips, mucosa, and tongue normal; teeth and gums normal Neck: no adenopathy, no carotid bruit, supple, symmetrical, trachea midline and thyroid not enlarged, symmetric, no tenderness/mass/nodules Back: symmetric, no curvature. ROM normal. No CVA tenderness. Lungs: clear to auscultation bilaterally Heart: regular rate and rhythm, S1, S2 normal, no murmur, click, rub or gallop Abdomen: soft, non-tender; bowel sounds normal; no masses,  no organomegaly Pulses: 2+ and symmetric Skin: Skin color, texture, turgor normal. No rashes or lesions Lymph nodes:  Cervical, supraclavicular, and axillary nodes normal.  Lab Results  Component Value Date   HGBA1C 5.4 01/25/2016    Lab Results  Component Value Date   CREATININE 0.77 01/25/2016   CREATININE 0.86 05/02/2014    Lab Results  Component Value Date   WBC 5.4 01/25/2016   HGB 13.4 01/25/2016   HCT 39.5 01/25/2016   PLT 294.0 01/25/2016   GLUCOSE 83 01/25/2016   CHOL 165 01/25/2016   TRIG 88.0 01/25/2016   HDL 47.10 01/25/2016   LDLCALC 100 (H) 01/25/2016   ALT 22 01/25/2016   AST 18 01/25/2016   NA 140 01/25/2016   K 4.0 01/25/2016   CL 104 01/25/2016   CREATININE 0.77 01/25/2016   BUN 12 01/25/2016   CO2 30 01/25/2016   TSH 1.52 01/25/2016   HGBA1C 5.4 01/25/2016    Ct Abdomen Pelvis W Contrast  Result Date: 09/29/2015 CLINICAL DATA:  Right periumbilical area pain. Prior right inguinal hernia repair. EXAM: CT ABDOMEN AND PELVIS WITH CONTRAST TECHNIQUE: Multidetector CT imaging of the abdomen and pelvis was performed using the standard protocol following bolus administration of intravenous contrast. CONTRAST:  118mL OMNIPAQUE IOHEXOL 350 MG/ML SOLN COMPARISON:  06/22/2014 FINDINGS: Lung bases are clear.  No effusions.  Heart is normal size. Liver, gallbladder, spleen, pancreas, adrenals kidneys unremarkable. Small exophytic cyst off the upper pole of the left kidney which is stable. No hydronephrosis. Moderate stool burden throughout the colon. Normal appendix. The stomach and small bowel are decompressed and unremarkable. Aorta is normal caliber. No evidence of inguinal or umbilical hernia. No abdominal wall abnormality. IMPRESSION: Moderate stool burden in the colon. No acute findings in the abdomen or pelvis. Electronically Signed   By: Rolm Baptise M.D.   On: 09/29/2015 11:00    Assessment & Plan:   Problem List Items Addressed This Visit    Functional abdominal pain syndrome    His symptoms suggest constipation predominant IBS and are improving with zoloft .  Will  increase the dsoe to 100 mg daily and reduce Linzess to 71 mg daily discussed prn use of simethicone,  Up to 500 mg daily, as well as Mylanta Gas,  And premeal uses of Beano and Lactaid.       History of drug withdrawal syndrome    History of seizure in the past during abuse of alprazolam.  Advised to reduce use  Of valium to daily  Buspirone prescribed bid as an alternative.        Other Visit Diagnoses   None.     I have discontinued Mr. Cutsforth's linaclotide, Lactulose, oseltamivir, polyethylene glycol, docusate sodium, and omeprazole. I have also changed his sertraline. Additionally, I am having him start on linaclotide and busPIRone. Lastly, I am having him maintain his MULTI-VITAMINS, Fish Oil, TURMERIC PO, cetirizine, diazepam, Simethicone, and  OVER THE COUNTER MEDICATION.  Meds ordered this encounter  Medications  . Simethicone 180 MG CAPS    Sig: Take by mouth 2 (two) times daily.  [name] OVER THE COUNTER MEDICATION    Sig: as needed (Acid Controller 1 tablet as needed).  [name] linaclotide (LINZESS) 72 MCG capsule    Sig: Take 1 capsule (72 mcg total) by mouth daily before breakfast.    Dispense:  30 capsule    Refill:  5  . sertraline (ZOLOFT) 100 MG tablet    Sig: Take 1 tablet (100 mg total) by mouth daily.    Dispense:  30 tablet    Refill:  2  . busPIRone (BUSPAR) 10 MG tablet    Sig: Take 1 tablet (10 mg total) by mouth 2 (two) times daily.    Dispense:  60 tablet    Refill:  2   A total of 25 minutes of face to face time was spent with patient more than half of which was spent in counselling about the above mentioned conditions  and coordination of care   Medications Discontinued During This Encounter  Medication Reason  . omeprazole (PRILOSEC) 40 MG capsule Error  . docusate sodium (COLACE) 100 MG capsule Error  . Lactulose 20 GM/30ML SOLN Error  . oseltamivir (TAMIFLU) 75 MG capsule Error  . polyethylene glycol (MIRALAX / GLYCOLAX) packet Error  . Linaclotide  (LINZESS) 145 MCG CAPS capsule   . sertraline (ZOLOFT) 50 MG tablet Reorder    Follow-up: Return in about 3 months (around 08/10/2016).   [name], MD

## 2016-05-10 NOTE — Patient Instructions (Addendum)
Increase the zoloft to 100 mg daily  Add buspirone up to twice daily .  Take the evening dose around 9:30.  If You are not sleepy by 10:30  Or 11:00 you can take the valium  . But not more than 2/week    You can use up to 500 mg simethicone daily on a regular basis. Also try Mylanta GAs for more immediate relief of gas pain    Linzess 72 mg daily or every other day to keep stools from being loose

## 2016-05-12 DIAGNOSIS — Z8659 Personal history of other mental and behavioral disorders: Secondary | ICD-10-CM | POA: Insufficient documentation

## 2016-05-12 NOTE — Assessment & Plan Note (Signed)
History of seizure in the past during abuse of alprazolam.  Advised to reduce use  Of valium to daily  Buspirone prescribed bid as an alternative.

## 2016-05-12 NOTE — Assessment & Plan Note (Signed)
His symptoms suggest constipation predominant IBS and are improving with zoloft .  Will increase the dsoe to 100 mg daily and reduce Linzess to 71 mg daily discussed prn use of simethicone,  Up to 500 mg daily, as well as Mylanta Gas,  And premeal uses of Beano and Lactaid.

## 2016-05-17 ENCOUNTER — Encounter: Payer: Self-pay | Admitting: Internal Medicine

## 2016-05-17 ENCOUNTER — Telehealth: Payer: Self-pay | Admitting: Internal Medicine

## 2016-08-04 ENCOUNTER — Other Ambulatory Visit: Payer: Self-pay | Admitting: Internal Medicine

## 2016-08-10 ENCOUNTER — Ambulatory Visit (INDEPENDENT_AMBULATORY_CARE_PROVIDER_SITE_OTHER): Payer: BLUE CROSS/BLUE SHIELD | Admitting: Internal Medicine

## 2016-08-10 ENCOUNTER — Encounter: Payer: Self-pay | Admitting: Internal Medicine

## 2016-08-10 VITALS — BP 126/80 | HR 74 | Temp 97.7°F | Ht 75.0 in | Wt 180.4 lb

## 2016-08-10 DIAGNOSIS — K591 Functional diarrhea: Secondary | ICD-10-CM | POA: Diagnosis not present

## 2016-08-10 DIAGNOSIS — K581 Irritable bowel syndrome with constipation: Secondary | ICD-10-CM | POA: Diagnosis not present

## 2016-08-10 DIAGNOSIS — D1803 Hemangioma of intra-abdominal structures: Secondary | ICD-10-CM | POA: Diagnosis not present

## 2016-08-10 DIAGNOSIS — N281 Cyst of kidney, acquired: Secondary | ICD-10-CM

## 2016-08-10 DIAGNOSIS — R778 Other specified abnormalities of plasma proteins: Secondary | ICD-10-CM

## 2016-08-10 DIAGNOSIS — K649 Unspecified hemorrhoids: Secondary | ICD-10-CM

## 2016-08-10 DIAGNOSIS — F411 Generalized anxiety disorder: Secondary | ICD-10-CM

## 2016-08-10 DIAGNOSIS — M4306 Spondylolysis, lumbar region: Secondary | ICD-10-CM

## 2016-08-10 DIAGNOSIS — K7689 Other specified diseases of liver: Secondary | ICD-10-CM

## 2016-08-10 DIAGNOSIS — K760 Fatty (change of) liver, not elsewhere classified: Secondary | ICD-10-CM

## 2016-08-10 LAB — COMPREHENSIVE METABOLIC PANEL
ALK PHOS: 65 U/L (ref 40–115)
ALT: 36 U/L (ref 9–46)
AST: 25 U/L (ref 10–40)
Albumin: 5.5 g/dL — ABNORMAL HIGH (ref 3.6–5.1)
BILIRUBIN TOTAL: 0.5 mg/dL (ref 0.2–1.2)
BUN: 11 mg/dL (ref 7–25)
CO2: 21 mmol/L (ref 20–31)
Calcium: 10.2 mg/dL (ref 8.6–10.3)
Chloride: 102 mmol/L (ref 98–110)
Creat: 0.75 mg/dL (ref 0.60–1.35)
GLUCOSE: 91 mg/dL (ref 65–99)
Potassium: 4 mmol/L (ref 3.5–5.3)
Sodium: 138 mmol/L (ref 135–146)
Total Protein: 8.6 g/dL — ABNORMAL HIGH (ref 6.1–8.1)

## 2016-08-10 MED ORDER — CYCLOBENZAPRINE HCL 10 MG PO TABS: 10.0000 mg | ORAL_TABLET | Freq: Three times a day (TID) | ORAL | 5 refills | Status: DC | PRN

## 2016-08-10 MED ORDER — BUSPIRONE HCL 10 MG PO TABS: 10.0000 mg | ORAL_TABLET | Freq: Three times a day (TID) | ORAL | 5 refills | Status: DC

## 2016-08-10 MED ORDER — PEG 3350-KCL-NA BICARB-NACL 420 G PO SOLR: 4000.0000 mL | Freq: Once | ORAL | 0 refills | Status: AC

## 2016-08-10 NOTE — Progress Notes (Addendum)
Subjective:  Patient ID: Gary Mccullough, male    DOB: [birth year]  Age: 45 y.o. MRN: [id number]  CC: The primary encounter diagnosis was Functional diarrhea. Diagnoses of Hemangioma of liver, Irritable bowel syndrome with constipation, GAD (generalized anxiety disorder), Bleeding hemorrhoid, Spondylolysis of lumbar region, Benign liver cyst, Renal cyst, acquired, left, Fatty infiltration of liver, and Elevated total protein were also pertinent to this visit.  HPI Gary Mccullough presents for follow up on  multiple issues.  40 minutes was spent with patient,  , over 50%  of which was spent in discussion of his  functional abdominal pain secondary to IBS complicated by depression/anxiety managed with zoloft and buspirone.  IBS:  He stopped linzess for a while,  Used an OTC supplement called Outta Gas for 3 days and his constipation resolved foe several weeks.  He regained the weight he had previously, lost.  The effets were transient , states that he was doing fine until 3 weeks ago,  Now all the symptoms are back and he is having small bowel movements, weight loss, so he has  resumed Linzess 72 mcg daily dose.   But now  having liquid stool ,  Hot flashes,  No appetite,  Night sweats,  And weight  loss .   Not hungry  Not eating wants to try a bowel cleanse.    2) Back pain :  History of L4-5 spondylolysis since age 23 .[name]  Wants muscle relaxalxer   3) Having small amount of BRBPR, secondary to  External Hemorrhoid .  Outpatient Medications Prior to Visit  Medication Sig Dispense Refill  . cetirizine (ZYRTEC) 10 MG tablet Take 10 mg by mouth daily as needed.     . diazepam (VALIUM) 5 MG tablet Take 1 tablet (5 mg total) by mouth every 12 (twelve) hours as needed for anxiety. 60 tablet 1  . linaclotide (LINZESS) 72 MCG capsule Take 1 capsule (72 mcg total) by mouth daily before breakfast. 30 capsule 5  . Multiple Vitamin (MULTI-VITAMINS) TABS Take by mouth.    . Omega-3 Fatty Acids (FISH  OIL) 1000 MG CAPS Take by mouth.    [name] OVER THE COUNTER MEDICATION as needed (Acid Controller 1 tablet as needed).    . sertraline (ZOLOFT) 100 MG tablet TAKE 1 TABLET (100 MG TOTAL) BY MOUTH DAILY. 30 tablet 2  . Simethicone 180 MG CAPS Take by mouth 2 (two) times daily.    . TURMERIC PO Take 2 capsules by mouth daily.    . busPIRone (BUSPAR) 10 MG tablet TAKE 1 TABLET (10 MG TOTAL) BY MOUTH 2 (TWO) TIMES DAILY. 60 tablet 2   No facility-administered medications prior to visit.     Review of Systems;  Patient denies headache, fevers, malaise, unintentional weight loss, skin rash, eye pain, sinus congestion and sinus pain, sore throat, dysphagia,  hemoptysis , cough, dyspnea, wheezing, chest pain, palpitations, orthopnea, edema, abdominal pain, nausea, melena, diarrhea, constipation, flank pain, dysuria, hematuria, urinary  Frequency, nocturia, numbness, tingling, seizures,  Focal weakness, Loss of consciousness,  Tremor, insomnia, depression, anxiety, and suicidal ideation.      Objective:  BP 126/80   Pulse 74   Temp 97.7 F (36.5 C) (Oral)   Ht 6\' 3"  (1.905 m)   Wt 180 lb 6.4 oz (81.8 kg)   SpO2 98%   BMI 22.55 kg/m   BP Readings from Last 3 Encounters:  08/10/16 126/80  05/10/16 110/72  01/25/16 108/78    Wt Readings  from Last 3 Encounters:  08/10/16 180 lb 6.4 oz (81.8 kg)  05/10/16 182 lb 8 oz (82.8 kg)  01/25/16 176 lb 4 oz (79.9 kg)    General appearance: alert, cooperative and appears stated age Ears: normal TM's and external ear canals both ears Throat: lips, mucosa, and tongue normal; teeth and gums normal Neck: no adenopathy, no carotid bruit, supple, symmetrical, trachea midline and thyroid not enlarged, symmetric, no tenderness/mass/nodules Back: symmetric, no curvature. ROM normal. No CVA tenderness. Lungs: clear to auscultation bilaterally Heart: regular rate and rhythm, S1, S2 normal, no murmur, click, rub or gallop Abdomen: soft, non-tender; bowel sounds  normal; no masses,  no organomegaly Pulses: 2+ and symmetric Skin: Skin color, texture, turgor normal. No rashes or lesions Lymph nodes: Cervical, supraclavicular, and axillary nodes normal.  Lab Results  Component Value Date   HGBA1C 5.4 01/25/2016    Lab Results  Component Value Date   CREATININE 0.75 08/10/2016   CREATININE 0.77 01/25/2016   CREATININE 0.86 05/02/2014    Lab Results  Component Value Date   WBC 5.4 01/25/2016   HGB 13.4 01/25/2016   HCT 39.5 01/25/2016   PLT 294.0 01/25/2016   GLUCOSE 91 08/10/2016   CHOL 165 01/25/2016   TRIG 88.0 01/25/2016   HDL 47.10 01/25/2016   LDLCALC 100 (H) 01/25/2016   ALT 36 08/10/2016   AST 25 08/10/2016   NA 138 08/10/2016   K 4.0 08/10/2016   CL 102 08/10/2016   CREATININE 0.75 08/10/2016   BUN 11 08/10/2016   CO2 21 08/10/2016   TSH 1.52 01/25/2016   HGBA1C 5.4 01/25/2016    Ct Abdomen Pelvis W Contrast  Result Date: 09/29/2015 CLINICAL DATA:  Right periumbilical area pain. Prior right inguinal hernia repair. EXAM: CT ABDOMEN AND PELVIS WITH CONTRAST TECHNIQUE: Multidetector CT imaging of the abdomen and pelvis was performed using the standard protocol following bolus administration of intravenous contrast. CONTRAST:  159mL OMNIPAQUE IOHEXOL 350 MG/ML SOLN COMPARISON:  06/22/2014 FINDINGS: Lung bases are clear.  No effusions.  Heart is normal size. Liver, gallbladder, spleen, pancreas, adrenals kidneys unremarkable. Small exophytic cyst off the upper pole of the left kidney which is stable. No hydronephrosis. Moderate stool burden throughout the colon. Normal appendix. The stomach and small bowel are decompressed and unremarkable. Aorta is normal caliber. No evidence of inguinal or umbilical hernia. No abdominal wall abnormality. IMPRESSION: Moderate stool burden in the colon. No acute findings in the abdomen or pelvis. Electronically Signed   By: Rolm Baptise M.D.   On: 09/29/2015 11:00    Assessment & Plan:   Problem  List Items Addressed This Visit    Fatty infiltration of liver    Noted on prior imaging studies.  Prior history of alcohol abuse prior to 2014.  Recommending starting the Hepatitis s A and B vaccines. Series. He has deferred for now       Irritable bowel syndrome (IBS)    Aggravated by anxiety . Continue zoloft and suspend linzess.  He is requesting a colonic cleanse.  Golytely prescribed,  Advised to start daily miralax afterward.       Hemangioma of liver    Reviewed prior imaging reports with patient, reassurance provided.       GAD (generalized anxiety disorder)    Continue zoloft ;  increase buspirone  dose. Avoiding benzodiazepines due to histoyr of alprazolam and alcohol abuse.       Bleeding hemorrhoid    Recommend trial of anusol HC and  glycerin suppositories.        Spondylolysis of lumbar region    Managed with NSAIDs and prn MR.       Benign liver cyst    Prior imaging studies reviewed per patient request and reassurance provided that the cyst was not seen on subsequent imaging.       Renal cyst, acquired, left    Incidental finding of exophytic cyst of upper pole of left kidney has not changed by prior imaging studies.  Has had urology follow up with Dr [name] ,  Offered second opinion.       Elevated total protein    EPISODIC occurring once before he became my patient.  Will have hi return for additional serologies (Hepatitis C, SPEP and urine immunfixation IFE)      Relevant Orders   IFE, Urine (with Total Protein)   Protein electrophoresis, serum   Hepatitis C antibody    Other Visit Diagnoses    Functional diarrhea    -  Primary   Relevant Orders   Comprehensive metabolic panel (Completed)   Magnesium (Completed)      I have changed Gary Mccullough's busPIRone. I am also having him start on polyethylene glycol-electrolytes and cyclobenzaprine. Additionally, I am having him maintain his MULTI-VITAMINS, Fish Oil, TURMERIC PO, cetirizine, diazepam,  Simethicone, OVER THE COUNTER MEDICATION, linaclotide, sertraline, ibuprofen, and naproxen.  Meds ordered this encounter  Medications  . ibuprofen (ADVIL,MOTRIN) 100 MG/5ML suspension    Sig: Take 200 mg by mouth every 4 (four) hours as needed.  . naproxen (NAPROSYN) 125 MG/5ML suspension    Sig: Take by mouth 2 (two) times daily as needed.  . polyethylene glycol-electrolytes (NULYTELY/GOLYTELY) 420 g solution    Sig: Take 4,000 mLs by mouth once.    Dispense:  4000 mL    Refill:  0  . busPIRone (BUSPAR) 10 MG tablet    Sig: Take 1 tablet (10 mg total) by mouth 3 (three) times daily.    Dispense:  90 tablet    Refill:  5  . cyclobenzaprine (FLEXERIL) 10 MG tablet    Sig: Take 1 tablet (10 mg total) by mouth 3 (three) times daily as needed for muscle spasms.    Dispense:  30 tablet    Refill:  5   A total of 40 minutes was spent with patient more than half of which was spent in counseling patient on the above mentioned issues , reviewing and explaining recent labs and imaging studies done, and coordination of care.  Medications Discontinued During This Encounter  Medication Reason  . busPIRone (BUSPAR) 10 MG tablet Reorder    Follow-up: Return in about 3 months (around 11/10/2016).   [name], MD

## 2016-08-10 NOTE — Progress Notes (Signed)
Pre visit review using our clinic review tool, if applicable. No additional management support is needed unless otherwise documented below in the visit note. 

## 2016-08-10 NOTE — Patient Instructions (Addendum)
We discussed Hepatitis A and B vaccines at your next visit. (to protect your liver)  No follow up needed oin the renal cyst right now  Last MRI showed only the hemangioma ,  Not the liver mass  Use the goiytely, then resume once daily miralax,  Ok to increase t 2 times daily or to use the OUTTA GAS as needed   Increase the buspirone to 15 mg twice daily or 10 mg three times daily    anusol HC for the irritated hemorrhoid    glycerin suppository for use before  solid bowel movements

## 2016-08-11 LAB — MAGNESIUM: Magnesium: 2.1 mg/dL (ref 1.5–2.5)

## 2016-08-12 ENCOUNTER — Encounter: Payer: Self-pay | Admitting: Internal Medicine

## 2016-08-12 DIAGNOSIS — M4306 Spondylolysis, lumbar region: Secondary | ICD-10-CM | POA: Insufficient documentation

## 2016-08-12 DIAGNOSIS — R778 Other specified abnormalities of plasma proteins: Secondary | ICD-10-CM | POA: Insufficient documentation

## 2016-08-12 DIAGNOSIS — K7689 Other specified diseases of liver: Secondary | ICD-10-CM | POA: Insufficient documentation

## 2016-08-12 DIAGNOSIS — N281 Cyst of kidney, acquired: Secondary | ICD-10-CM | POA: Insufficient documentation

## 2016-08-12 DIAGNOSIS — F411 Generalized anxiety disorder: Secondary | ICD-10-CM | POA: Insufficient documentation

## 2016-08-12 DIAGNOSIS — K649 Unspecified hemorrhoids: Secondary | ICD-10-CM | POA: Insufficient documentation

## 2016-08-12 NOTE — Assessment & Plan Note (Signed)
Managed with NSAIDs and prn MR.

## 2016-08-12 NOTE — Assessment & Plan Note (Signed)
Incidental finding of exophytic cyst of upper pole of left kidney has not changed by prior imaging studies.  Has had urology follow up with Dr [name] ,  Offered second opinion.

## 2016-08-12 NOTE — Assessment & Plan Note (Signed)
Noted on prior imaging studies.  Prior history of alcohol abuse prior to 2014.  Recommending starting the Hepatitis s A and B vaccines. Series. He has deferred for now

## 2016-08-12 NOTE — Assessment & Plan Note (Signed)
Recommend trial of anusol HC and glycerin suppositories.

## 2016-08-12 NOTE — Assessment & Plan Note (Signed)
EPISODIC occurring once before he became my patient.  Will have hi return for additional serologies (Hepatitis C, SPEP and urine immunfixation IFE)

## 2016-08-12 NOTE — Assessment & Plan Note (Signed)
Continue zoloft ;  increase buspirone  dose. Avoiding benzodiazepines due to histoyr of alprazolam and alcohol abuse.

## 2016-08-12 NOTE — Assessment & Plan Note (Addendum)
Prior imaging studies reviewed per patient request and reassurance provided that the cyst was not seen on subsequent imaging.

## 2016-08-12 NOTE — Assessment & Plan Note (Addendum)
Aggravated by anxiety . Continue zoloft and suspend linzess.  He is requesting a colonic cleanse.  Golytely prescribed,  Advised to start daily miralax afterward.

## 2016-08-12 NOTE — Assessment & Plan Note (Signed)
Reviewed prior imaging reports with patient, reassurance provided.

## 2016-08-12 NOTE — Addendum Note (Signed)
Addended by: [name] on: 08/12/2016 08:58 PM   Modules accepted: Orders

## 2016-08-13 ENCOUNTER — Other Ambulatory Visit (INDEPENDENT_AMBULATORY_CARE_PROVIDER_SITE_OTHER): Payer: BLUE CROSS/BLUE SHIELD

## 2016-08-13 DIAGNOSIS — R778 Other specified abnormalities of plasma proteins: Secondary | ICD-10-CM | POA: Diagnosis not present

## 2016-08-14 LAB — HEPATITIS C ANTIBODY: HCV Ab: NEGATIVE

## 2016-08-15 ENCOUNTER — Other Ambulatory Visit: Payer: BLUE CROSS/BLUE SHIELD

## 2016-08-16 ENCOUNTER — Encounter: Payer: Self-pay | Admitting: Internal Medicine

## 2016-08-16 LAB — PROTEIN ELECTROPHORESIS, SERUM
ALPHA-1-GLOBULIN: 0.3 g/dL (ref 0.2–0.3)
ALPHA-2-GLOBULIN: 0.6 g/dL (ref 0.5–0.9)
Albumin ELP: 5.1 g/dL — ABNORMAL HIGH (ref 3.8–4.8)
BETA GLOBULIN: 0.5 g/dL (ref 0.4–0.6)
Beta 2: 0.4 g/dL (ref 0.2–0.5)
GAMMA GLOBULIN: 1.1 g/dL (ref 0.8–1.7)
Total Protein, Serum Electrophoresis: 7.9 g/dL (ref 6.1–8.1)

## 2016-08-20 ENCOUNTER — Other Ambulatory Visit: Payer: BLUE CROSS/BLUE SHIELD

## 2016-08-20 DIAGNOSIS — R778 Other specified abnormalities of plasma proteins: Secondary | ICD-10-CM

## 2016-08-24 LAB — UIFE/LIGHT CHAINS/TP QN, 24-HR UR
FR KAPPA LT CH,24HR: 2 mg/(24.h)
FR LAMBDA LT CH,24HR: 17 mg/(24.h)
FREE LAMBDA LT CHAINS, UR: 3.04 mg/L (ref 0.24–6.66)
Free Kappa Lt Chains,Ur: 0.44 mg/L — ABNORMAL LOW (ref 1.35–24.19)
KAPPA/LAMBDA RATIO, U: 0.14 — AB (ref 2.04–10.37)
Protein, 24H Urine: <224 mg/24 hr — ABNORMAL HIGH (ref 30–150)
Protein, Ur: <4 mg/dL

## 2016-08-26 ENCOUNTER — Encounter: Payer: Self-pay | Admitting: Internal Medicine

## 2016-10-02 ENCOUNTER — Encounter: Payer: Self-pay | Admitting: Internal Medicine

## 2016-10-04 MED ORDER — BUSPIRONE HCL 15 MG PO TABS: 15.0000 mg | ORAL_TABLET | Freq: Three times a day (TID) | ORAL | 5 refills | Status: DC

## 2016-10-04 NOTE — Telephone Encounter (Signed)
Patient taking the buspirone 15 mg BID ok to update script.

## 2016-10-31 ENCOUNTER — Other Ambulatory Visit: Payer: Self-pay | Admitting: Internal Medicine

## 2016-11-13 ENCOUNTER — Ambulatory Visit (INDEPENDENT_AMBULATORY_CARE_PROVIDER_SITE_OTHER): Payer: BLUE CROSS/BLUE SHIELD | Admitting: Internal Medicine

## 2016-11-13 ENCOUNTER — Encounter: Payer: Self-pay | Admitting: Internal Medicine

## 2016-11-13 DIAGNOSIS — R030 Elevated blood-pressure reading, without diagnosis of hypertension: Secondary | ICD-10-CM | POA: Diagnosis not present

## 2016-11-13 DIAGNOSIS — Z23 Encounter for immunization: Secondary | ICD-10-CM

## 2016-11-13 DIAGNOSIS — K581 Irritable bowel syndrome with constipation: Secondary | ICD-10-CM

## 2016-11-13 DIAGNOSIS — F411 Generalized anxiety disorder: Secondary | ICD-10-CM | POA: Diagnosis not present

## 2016-11-13 MED ORDER — SERTRALINE HCL 100 MG PO TABS: 100.0000 mg | ORAL_TABLET | Freq: Every day | ORAL | 2 refills | Status: DC

## 2016-11-13 MED ORDER — BUSPIRONE HCL 15 MG PO TABS: 15.0000 mg | ORAL_TABLET | Freq: Two times a day (BID) | ORAL | 2 refills | Status: DC

## 2016-11-13 NOTE — Progress Notes (Signed)
Subjective:  Patient ID: Gary Mccullough, male    DOB: May 16, 1971  Age: 46 y.o. MRN: [id number]  CC: Diagnoses of Need for hepatitis A and B vaccination, Irritable bowel syndrome with constipation, GAD (generalized anxiety disorder), and Elevated blood-pressure reading without diagnosis of hypertension were pertinent to this visit.  HPI Gary Mccullough presents for follow up on IBS and GAD   Last seen in November .  Has stopped the linzess.  Still taking zoloft//buspirone.   Used the miralax and catharticd and added probiotics and stool softener.    Taking priobiotics and stool softener and moving bowels daily,  Still scant bleeding.  Not using linzess.  Using buspirone 15 mg bid   Using external lidocaine for pain .  Still minimal bleeding.   Walking 6 miles daily  Burns 3800  To 5000 calories daily  Pulse 63  Per fit bit    Outpatient Medications Prior to Visit  Medication Sig Dispense Refill  . cetirizine (ZYRTEC) 10 MG tablet Take 10 mg by mouth daily as needed.     . cyclobenzaprine (FLEXERIL) 10 MG tablet Take 1 tablet (10 mg total) by mouth 3 (three) times daily as needed for muscle spasms. 30 tablet 5  . diazepam (VALIUM) 5 MG tablet Take 1 tablet (5 mg total) by mouth every 12 (twelve) hours as needed for anxiety. 60 tablet 1  . ibuprofen (ADVIL,MOTRIN) 100 MG/5ML suspension Take 200 mg by mouth every 4 (four) hours as needed.    . Multiple Vitamin (MULTI-VITAMINS) TABS Take by mouth.    . naproxen (NAPROSYN) 125 MG/5ML suspension Take by mouth 2 (two) times daily as needed.    . Omega-3 Fatty Acids (FISH OIL) 1000 MG CAPS Take by mouth.    [name] OVER THE COUNTER MEDICATION as needed (Acid Controller 1 tablet as needed).    . Simethicone 180 MG CAPS Take by mouth 2 (two) times daily.    . TURMERIC PO Take 2 capsules by mouth daily.    . busPIRone (BUSPAR) 10 MG tablet TAKE 1 TABLET (10 MG TOTAL) BY MOUTH 2 (TWO) TIMES DAILY. 60 tablet 2  . busPIRone (BUSPAR) 15 MG tablet  Take 1 tablet (15 mg total) by mouth 3 (three) times daily. (Patient taking differently: Take 15 mg by mouth 2 (two) times daily. ) 90 tablet 5  . sertraline (ZOLOFT) 100 MG tablet TAKE 1 TABLET (100 MG TOTAL) BY MOUTH DAILY. 30 tablet 2  . linaclotide (LINZESS) 72 MCG capsule Take 1 capsule (72 mcg total) by mouth daily before breakfast. (Patient not taking: Reported on 11/13/2016) 30 capsule 5   No facility-administered medications prior to visit.     Review of Systems;  Patient denies headache, fevers, malaise, unintentional weight loss, skin rash, eye pain, sinus congestion and sinus pain, sore throat, dysphagia,  hemoptysis , cough, dyspnea, wheezing, chest pain, palpitations, orthopnea, edema, abdominal pain, nausea, melena, diarrhea, constipation, flank pain, dysuria, hematuria, urinary  Frequency, nocturia, numbness, tingling, seizures,  Focal weakness, Loss of consciousness,  Tremor, insomnia, depression, anxiety, and suicidal ideation.      Objective:  BP 118/90   Pulse 82   Resp 16   Wt 195 lb (88.5 kg)   SpO2 97%   BMI 24.37 kg/m   BP Readings from Last 3 Encounters:  11/13/16 118/90  08/10/16 126/80  05/10/16 110/72    Wt Readings from Last 3 Encounters:  11/13/16 195 lb (88.5 kg)  08/10/16 180 lb 6.4 oz (81.8 kg)  05/10/16 182 lb 8 oz (82.8 kg)    General appearance: alert, cooperative and appears stated age Ears: normal TM's and external ear canals both ears Throat: lips, mucosa, and tongue normal; teeth and gums normal Neck: no adenopathy, no carotid bruit, supple, symmetrical, trachea midline and thyroid not enlarged, symmetric, no tenderness/mass/nodules Back: symmetric, no curvature. ROM normal. No CVA tenderness. Lungs: clear to auscultation bilaterally Heart: regular rate and rhythm, S1, S2 normal, no murmur, click, rub or gallop Abdomen: soft, non-tender; bowel sounds normal; no masses,  no organomegaly Pulses: 2+ and symmetric Skin: Skin color,  texture, turgor normal. No rashes or lesions Lymph nodes: Cervical, supraclavicular, and axillary nodes normal.  Lab Results  Component Value Date   HGBA1C 5.4 01/25/2016    Lab Results  Component Value Date   CREATININE 0.75 08/10/2016   CREATININE 0.77 01/25/2016   CREATININE 0.86 05/02/2014    Lab Results  Component Value Date   WBC 5.4 01/25/2016   HGB 13.4 01/25/2016   HCT 39.5 01/25/2016   PLT 294.0 01/25/2016   GLUCOSE 91 08/10/2016   CHOL 165 01/25/2016   TRIG 88.0 01/25/2016   HDL 47.10 01/25/2016   LDLCALC 100 (H) 01/25/2016   ALT 36 08/10/2016   AST 25 08/10/2016   NA 138 08/10/2016   K 4.0 08/10/2016   CL 102 08/10/2016   CREATININE 0.75 08/10/2016   BUN 11 08/10/2016   CO2 21 08/10/2016   TSH 1.52 01/25/2016   HGBA1C 5.4 01/25/2016    Assessment & Plan:   Problem List Items Addressed This Visit    Elevated blood-pressure reading without diagnosis of hypertension    He has no prior history of hypertension. He will check his blood pressure several times over the next 3-4 weeks and to submit readings for evaluation.       GAD (generalized anxiety disorder)    Controlled with zoloft and buspirone. No changes today.       Irritable bowel syndrome (IBS)    Symptoms are currently quiescent on regimen of probiotics and stool softener.  He is gaining weight       Relevant Medications   Probiotic Product (PROBIOTIC DAILY PO)   docusate sodium (COLACE) 100 MG capsule    Other Visit Diagnoses    Need for hepatitis A and B vaccination       Relevant Orders   Hepatitis A hepatitis B combined vaccine IM (Completed)      I have discontinued Mr. Prins's busPIRone. I have also changed his busPIRone. Additionally, I am having him maintain his MULTI-VITAMINS, Fish Oil, TURMERIC PO, cetirizine, diazepam, Simethicone, OVER THE COUNTER MEDICATION, linaclotide, ibuprofen, naproxen, cyclobenzaprine, Probiotic Product (PROBIOTIC DAILY PO), docusate sodium, and  sertraline.  Meds ordered this encounter  Medications  . Probiotic Product (PROBIOTIC DAILY PO)    Sig: Take 1 capsule by mouth 3 (three) times daily.  [name] docusate sodium (COLACE) 100 MG capsule    Sig: Take 100 mg by mouth 2 (two) times daily.  . busPIRone (BUSPAR) 15 MG tablet    Sig: Take 1 tablet (15 mg total) by mouth 2 (two) times daily.    Dispense:  180 tablet    Refill:  2  . sertraline (ZOLOFT) 100 MG tablet    Sig: Take 1 tablet (100 mg total) by mouth daily.    Dispense:  90 tablet    Refill:  2    Medications Discontinued During This Encounter  Medication Reason  . busPIRone (BUSPAR) 10  MG tablet Change in therapy  . busPIRone (BUSPAR) 15 MG tablet Reorder  . sertraline (ZOLOFT) 100 MG tablet Reorder    Follow-up: Return in about 4 weeks (around 12/11/2016) for twin Rx vaccine #2 dose .   [name], MD

## 2016-11-13 NOTE — Patient Instructions (Signed)
Glad the bowels are moving regularly   The new goals for optimal blood pressure management are 120/70.  Please check your blood pressure a few times at home and send me the readings so I can determine if you need  To have any therapy

## 2016-11-13 NOTE — Progress Notes (Signed)
Pre visit review using our clinic review tool, if applicable. No additional management support is needed unless otherwise documented below in the visit note. 

## 2016-11-15 DIAGNOSIS — R030 Elevated blood-pressure reading, without diagnosis of hypertension: Secondary | ICD-10-CM | POA: Insufficient documentation

## 2016-11-15 DIAGNOSIS — I10 Essential (primary) hypertension: Secondary | ICD-10-CM | POA: Insufficient documentation

## 2016-11-15 NOTE — Assessment & Plan Note (Signed)
Controlled with zoloft and buspirone. No changes today.

## 2016-11-15 NOTE — Assessment & Plan Note (Signed)
He has no prior history of hypertension. He will check his blood pressure several times over the next 3-4 weeks and to submit readings for evaluation.

## 2016-11-15 NOTE — Assessment & Plan Note (Signed)
Symptoms are currently quiescent on regimen of probiotics and stool softener.  He is gaining weight

## 2016-11-30 ENCOUNTER — Encounter: Payer: Self-pay | Admitting: Internal Medicine

## 2016-12-11 ENCOUNTER — Ambulatory Visit (INDEPENDENT_AMBULATORY_CARE_PROVIDER_SITE_OTHER): Payer: BLUE CROSS/BLUE SHIELD | Admitting: *Deleted

## 2016-12-11 DIAGNOSIS — Z23 Encounter for immunization: Secondary | ICD-10-CM | POA: Diagnosis not present

## 2016-12-11 NOTE — Progress Notes (Signed)
Patient presented for 2SD A/b vaccine to right deltoid, patient showed no signs of distress during visit and voiced none.

## 2017-03-28 ENCOUNTER — Other Ambulatory Visit: Payer: Self-pay | Admitting: Internal Medicine

## 2017-05-13 ENCOUNTER — Encounter: Payer: Self-pay | Admitting: Internal Medicine

## 2017-05-13 ENCOUNTER — Ambulatory Visit (INDEPENDENT_AMBULATORY_CARE_PROVIDER_SITE_OTHER): Payer: BLUE CROSS/BLUE SHIELD | Admitting: Internal Medicine

## 2017-05-13 VITALS — BP 116/72 | HR 63 | Temp 98.2°F | Resp 15 | Ht 75.0 in | Wt 198.8 lb

## 2017-05-13 DIAGNOSIS — Z23 Encounter for immunization: Secondary | ICD-10-CM

## 2017-05-13 DIAGNOSIS — K5904 Chronic idiopathic constipation: Secondary | ICD-10-CM

## 2017-05-13 DIAGNOSIS — K581 Irritable bowel syndrome with constipation: Secondary | ICD-10-CM | POA: Diagnosis not present

## 2017-05-13 DIAGNOSIS — F411 Generalized anxiety disorder: Secondary | ICD-10-CM | POA: Diagnosis not present

## 2017-05-13 DIAGNOSIS — M7702 Medial epicondylitis, left elbow: Secondary | ICD-10-CM

## 2017-05-13 MED ORDER — BUSPIRONE HCL 15 MG PO TABS: 15.0000 mg | ORAL_TABLET | Freq: Three times a day (TID) | ORAL | 5 refills | Status: DC

## 2017-05-13 MED ORDER — BUPROPION HCL ER (XL) 150 MG PO TB24: 150.0000 mg | ORAL_TABLET | Freq: Every day | ORAL | 2 refills | Status: DC

## 2017-05-13 NOTE — Progress Notes (Signed)
Subjective:  Patient ID: Gary Mccullough, male    DOB: Apr 06, 1971  Age: 46 y.o. MRN: [id number]  CC: The primary encounter diagnosis was Need for prophylactic vaccination against hepatitis A and hepatitis B. Diagnoses of Chronic idiopathic constipation, Irritable bowel syndrome with constipation, GAD (generalized anxiety disorder), and Medial epicondylitis of elbow, left were also pertinent to this visit.  HPI Gary Mccullough presents for follow up on GAD, IBS  And other issues. [name]    GAD: Managed with buspirone and , zoloft . Now using buspirone three times daily to manage insomnia.   Requesting therapy change. Patient is self employed Forensic psychologist and reports no  motivation,  Feels irritable, wants to change zoloft.  Denies feelings of worthlessness, remorse and suicidality. No marriage problems.   IBS:  His constipation has returned  As of May,  Was taking daily Probiotics and stool softener ,  Stopped the stolf softener and added miralax in May due to increased trouble.  Bowels were moving better until July, now having small caliber daily BMs since July (states thtat the caliber is mo bigger than his index finger, but long in length. Diagnostic Colonoscopy was normal , done  By Dr. [name] in Sept  2015 when he began to have recurrent RLQ pain and slow transit constipation after an elective right inguinal hernia repair by Dr [name]. .  Continues to have rectal bleeding about  every other stool attributed to hemorrhoids.    Last BM was this morning. .  Did not tolerate linzess due to recurrent diarrhea causing dehydration, tried it again in May, same effect.   Drinking 120 ounces of water daily. Also noting that he does not tolerate red meat due to bloating,  Tolerates fish and pork.   Had gained 15 lbs at  His last visit ,  And is currently Up 3  More lbs since then.  BMI now 23   Home bps checked after last visit were all < 130/80  Has been treating a sef diagnosed left elbow  golfer's which he states started in April after playing 18 holes of golf nearly daily. .  Suspended golf until June.  .  Using RICE system (naproxen  Plus tylenol). Pain has improved but not completely resolved.    Outpatient Medications Prior to Visit  Medication Sig Dispense Refill  . cetirizine (ZYRTEC) 10 MG tablet Take 10 mg by mouth daily as needed.     . cyclobenzaprine (FLEXERIL) 10 MG tablet Take 1 tablet (10 mg total) by mouth 3 (three) times daily as needed for muscle spasms. 30 tablet 5  . ibuprofen (ADVIL,MOTRIN) 100 MG/5ML suspension Take 200 mg by mouth every 4 (four) hours as needed.    . linaclotide (LINZESS) 72 MCG capsule Take 1 capsule (72 mcg total) by mouth daily before breakfast. 30 capsule 5  . Multiple Vitamin (MULTI-VITAMINS) TABS Take by mouth.    . naproxen (NAPROSYN) 125 MG/5ML suspension Take 250 mg by mouth 2 (two) times daily as needed.     . Omega-3 Fatty Acids (FISH OIL) 1000 MG CAPS Take by mouth.    . Probiotic Product (PROBIOTIC DAILY PO) Take 1 capsule by mouth 3 (three) times daily.    . Simethicone 180 MG CAPS Take by mouth 2 (two) times daily.    . TURMERIC PO Take 2 capsules by mouth daily.    . busPIRone (BUSPAR) 15 MG tablet Take 1 tablet (15 mg total) by mouth 2 (two)  times daily. 180 tablet 2  . diazepam (VALIUM) 5 MG tablet Take 1 tablet (5 mg total) by mouth every 12 (twelve) hours as needed for anxiety. 60 tablet 1  . sertraline (ZOLOFT) 100 MG tablet Take 1 tablet (100 mg total) by mouth daily. 90 tablet 2  . busPIRone (BUSPAR) 15 MG tablet TAKE 1 TABLET (15 MG TOTAL) BY MOUTH 3 (THREE) TIMES DAILY. (Patient not taking: Reported on 05/13/2017) 90 tablet 5  . docusate sodium (COLACE) 100 MG capsule Take 100 mg by mouth 2 (two) times daily.    [name] OVER THE COUNTER MEDICATION as needed (Acid Controller 1 tablet as needed).     No facility-administered medications prior to visit.     Review of Systems;  Patient denies headache, fevers, malaise,  unintentional weight loss, skin rash, eye pain, sinus congestion and sinus pain, sore throat, dysphagia,  hemoptysis , cough, dyspnea, wheezing, chest pain, palpitations, orthopnea, edema, abdominal pain, nausea, melena, diarrhea, constipation, flank pain, dysuria, hematuria, urinary  Frequency, nocturia, numbness, tingling, seizures,  Focal weakness, Loss of consciousness,  Tremor,  Untreated insomnia, depression, anxiety, and suicidal ideation.      Objective:  BP 116/72 (BP Location: Left Arm, Patient Position: Sitting, Cuff Size: Normal)   Pulse 63   Temp 98.2 F (36.8 C) (Oral)   Resp 15   Ht 6\' 3"  (1.905 m)   Wt 198 lb 12.8 oz (90.2 kg)   SpO2 99%   BMI 24.85 kg/m   BP Readings from Last 3 Encounters:  05/13/17 116/72  11/13/16 118/90  08/10/16 126/80    Wt Readings from Last 3 Encounters:  05/13/17 198 lb 12.8 oz (90.2 kg)  11/13/16 195 lb (88.5 kg)  08/10/16 180 lb 6.4 oz (81.8 kg)    General appearance: alert, cooperative and appears stated age Ears: normal TM's and external ear canals both ears Throat: lips, mucosa, and tongue normal; teeth and gums normal Neck: no adenopathy, no carotid bruit, supple, symmetrical, trachea midline and thyroid not enlarged, symmetric, no tenderness/mass/nodules Back: symmetric, no curvature. ROM normal. No CVA tenderness. Lungs: clear to auscultation bilaterally Heart: regular rate and rhythm, S1, S2 normal, no murmur, click, rub or gallop Abdomen: soft, non-tender; bowel sounds normal; no masses,  no organomegaly Pulses: 2+ and symmetric Skin: Skin color, texture, turgor normal. No rashes or lesions Lymph nodes: Cervical, supraclavicular, and axillary nodes normal. Psych: affect flat,, makes good eye contact. No fidgeting,  Smiles easily.  Denies suicidal thoughts   Lab Results  Component Value Date   HGBA1C 5.4 01/25/2016    Lab Results  Component Value Date   CREATININE 0.75 08/10/2016   CREATININE 0.77 01/25/2016    CREATININE 0.86 05/02/2014    Lab Results  Component Value Date   WBC 5.4 01/25/2016   HGB 13.4 01/25/2016   HCT 39.5 01/25/2016   PLT 294.0 01/25/2016   GLUCOSE 91 08/10/2016   CHOL 165 01/25/2016   TRIG 88.0 01/25/2016   HDL 47.10 01/25/2016   LDLCALC 100 (H) 01/25/2016   ALT 36 08/10/2016   AST 25 08/10/2016   NA 138 08/10/2016   K 4.0 08/10/2016   CL 102 08/10/2016   CREATININE 0.75 08/10/2016   BUN 11 08/10/2016   CO2 21 08/10/2016   TSH 1.52 01/25/2016   HGBA1C 5.4 01/25/2016    Ct Abdomen Pelvis W Contrast  Result Date: 09/29/2015 CLINICAL DATA:  Right periumbilical area pain. Prior right inguinal hernia repair. EXAM: CT ABDOMEN AND PELVIS WITH CONTRAST  TECHNIQUE: Multidetector CT imaging of the abdomen and pelvis was performed using the standard protocol following bolus administration of intravenous contrast. CONTRAST:  169mL OMNIPAQUE IOHEXOL 350 MG/ML SOLN COMPARISON:  06/22/2014 FINDINGS: Lung bases are clear.  No effusions.  Heart is normal size. Liver, gallbladder, spleen, pancreas, adrenals kidneys unremarkable. Small exophytic cyst off the upper pole of the left kidney which is stable. No hydronephrosis. Moderate stool burden throughout the colon. Normal appendix. The stomach and small bowel are decompressed and unremarkable. Aorta is normal caliber. No evidence of inguinal or umbilical hernia. No abdominal wall abnormality. IMPRESSION: Moderate stool burden in the colon. No acute findings in the abdomen or pelvis. Electronically Signed   By: Rolm Baptise M.D.   On: 09/29/2015 11:00    Assessment & Plan:   Problem List Items Addressed This Visit    Medial epicondylitis of elbow, left    Improving with reduction in golf and use of RICE therapy.        Relevant Medications   aspirin 325 MG tablet   acetaminophen (TYLENOL) 325 MG tablet   Irritable bowel syndrome (IBS)    With slow transit constipation and bloating as primary symptoms. Has not tolerated Linzess  on multiple trials due to diarrhea and resulting dehydration. Reporting that stools have been small caliber since July . Referral to Dr Allen Norris for evaluation.       Relevant Medications   esomeprazole (NEXIUM) 20 MG capsule   famotidine (PEPCID) 10 MG tablet   GAD (generalized anxiety disorder)    Symptoms of anxiety appear to be well controlled with buspirone tid and zoloft 100 mg daily.  Is currently reporting lack of motivation and concentration.  Discussed adding a trial of wellbutrin XL 150 mg daily in the am .       Other Visit Diagnoses    Need for prophylactic vaccination against hepatitis A and hepatitis B    -  Primary   Relevant Orders   Hepatitis A hepatitis B combined vaccine IM (Completed)   Chronic idiopathic constipation       Relevant Orders   Ambulatory referral to Gastroenterology   Comprehensive metabolic panel   Magnesium   TSH      I have discontinued Mr. Rollinson's diazepam, OVER THE COUNTER MEDICATION, docusate sodium, sertraline, and busPIRone. I have also changed his busPIRone. Additionally, I am having him start on buPROPion. Lastly, I am having him maintain his MULTI-VITAMINS, Fish Oil, TURMERIC PO, cetirizine, Simethicone, linaclotide, ibuprofen, naproxen, cyclobenzaprine, Probiotic Product (PROBIOTIC DAILY PO), esomeprazole, famotidine, aspirin, and acetaminophen.  Meds ordered this encounter  Medications  . esomeprazole (NEXIUM) 20 MG capsule    Sig: Take 20 mg by mouth daily at 12 noon.  . famotidine (PEPCID) 10 MG tablet    Sig: Take 10 mg by mouth 2 (two) times daily.  [name] aspirin 325 MG tablet    Sig: Take 325 mg by mouth daily.  [name] acetaminophen (TYLENOL) 325 MG tablet    Sig: Take 325 mg by mouth every 6 (six) hours as needed.  . busPIRone (BUSPAR) 15 MG tablet    Sig: Take 1 tablet (15 mg total) by mouth 3 (three) times daily.    Dispense:  90 tablet    Refill:  5  . buPROPion (WELLBUTRIN XL) 150 MG 24 hr tablet    Sig: Take 1 tablet (150 mg  total) by mouth daily.    Dispense:  30 tablet    Refill:  2  Medications Discontinued During This Encounter  Medication Reason  . busPIRone (BUSPAR) 15 MG tablet Duplicate  . OVER THE COUNTER MEDICATION Patient has not taken in last 30 days  . docusate sodium (COLACE) 100 MG capsule Patient has not taken in last 30 days  . busPIRone (BUSPAR) 15 MG tablet Reorder  . sertraline (ZOLOFT) 100 MG tablet   . diazepam (VALIUM) 5 MG tablet     Follow-up: Return in about 6 months (around 11/13/2017).   [name], MD

## 2017-05-13 NOTE — Patient Instructions (Addendum)
To treat tendonitis  Make naproxen twice daily .  Add tylenol to this regiemn if needed for pain    For the anxiety and lack of motivation  conitnue zoloft and buspirone,  Add wellbutrin in the morning   referral to dr Allen Norris for the change in stool pattern

## 2017-05-14 ENCOUNTER — Encounter: Payer: Self-pay | Admitting: Internal Medicine

## 2017-05-14 DIAGNOSIS — M7702 Medial epicondylitis, left elbow: Secondary | ICD-10-CM | POA: Insufficient documentation

## 2017-05-14 LAB — COMPREHENSIVE METABOLIC PANEL
ALK PHOS: 54 U/L (ref 39–117)
ALT: 37 U/L (ref 0–53)
AST: 24 U/L (ref 0–37)
Albumin: 5.2 g/dL (ref 3.5–5.2)
BILIRUBIN TOTAL: 0.3 mg/dL (ref 0.2–1.2)
BUN: 15 mg/dL (ref 6–23)
CO2: 28 mEq/L (ref 19–32)
Calcium: 9.8 mg/dL (ref 8.4–10.5)
Chloride: 103 mEq/L (ref 96–112)
Creatinine, Ser: 0.79 mg/dL (ref 0.40–1.50)
GFR: 112.86 mL/min (ref >60.00)
GLUCOSE: 91 mg/dL (ref 70–99)
Potassium: 4.4 mEq/L (ref 3.5–5.1)
SODIUM: 138 meq/L (ref 135–145)
TOTAL PROTEIN: 7.4 g/dL (ref 6.0–8.3)

## 2017-05-14 LAB — TSH: TSH: 1.53 u[IU]/mL (ref 0.35–4.50)

## 2017-05-14 LAB — MAGNESIUM: Magnesium: 2 mg/dL (ref 1.5–2.5)

## 2017-05-14 NOTE — Assessment & Plan Note (Signed)
Symptoms of anxiety appear to be well controlled with buspirone tid and zoloft 100 mg daily.  Is currently reporting lack of motivation and concentration.  Discussed adding a trial of wellbutrin XL 150 mg daily in the am .

## 2017-05-14 NOTE — Assessment & Plan Note (Signed)
With slow transit constipation and bloating as primary symptoms. Has not tolerated Linzess on multiple trials due to diarrhea and resulting dehydration. Reporting that stools have been small caliber since July . Referral to Dr Allen Norris for evaluation.

## 2017-05-14 NOTE — Assessment & Plan Note (Signed)
Improving with reduction in golf and use of RICE therapy.

## 2017-06-24 ENCOUNTER — Other Ambulatory Visit: Payer: Self-pay

## 2017-06-24 ENCOUNTER — Encounter: Payer: Self-pay | Admitting: Gastroenterology

## 2017-06-24 ENCOUNTER — Ambulatory Visit (INDEPENDENT_AMBULATORY_CARE_PROVIDER_SITE_OTHER): Payer: BLUE CROSS/BLUE SHIELD | Admitting: Gastroenterology

## 2017-06-24 VITALS — BP 118/69 | HR 80 | Temp 98.2°F | Ht 75.0 in | Wt 198.0 lb

## 2017-06-24 DIAGNOSIS — K581 Irritable bowel syndrome with constipation: Secondary | ICD-10-CM | POA: Diagnosis not present

## 2017-06-24 NOTE — Progress Notes (Signed)
Gastroenterology Consultation  Referring Provider:     [name], MD Primary Care Physician:  [name], MD Primary Gastroenterologist:  Dr. Allen Norris     Reason for Consultation:     Constipation        HPI:   Gary Mccullough is a 46 y.o. y/o male referred for consultation & management of Constipation by Dr. Derrel Nip, Aris Everts, MD.  This patient comes today after being seen by Dr. Candace Cruise in the past for the same problems.  The patient reports that he started having constipation after having a hernia repair.  The patient states that at one point he had a hard stool that was so large that he had to push on his abdomen to have a bowel movement.  The patient also reports that time he had lost weight because of his symptoms.  The patient was tried on Linzess and states that he tried all the different doses and each and every one of them resulted in diarrhea.  The patient denies any further weight loss but states that he still has some abdominal pain when he doesn't move his bowels.  The patient has been taking Citrucel and Senokot for his constipation and states that he is moving his bowels once or twice a day.  There is no report of any black stools or bloody stools.  The patient also reports that he has had 2 colonoscopies in the past for this.  The patient also had a CT scan and MRI of his abdomen.  The upper GI and small bowel follow-through did not show any abnormalities in his small bowel or stomach.  The patient also was told that he had increased motility on his small bowel follow-through.  The patient CT scan did show him to have a  Moderate stool burden.  The patient also reports that he has bloating and increased bowel sounds.  He states that at night he will lay on the ground and massage his abdomen which will cause it to make a lot of noises.  Past Medical History:  Diagnosis Date  . Alcohol abuse    Last Heavy Use 2014  . Allergy   . Cervical stenosis of spine   . Chicken pox   .  Fatty liver   . Liver hemangioma   . Seizures ([place])   . Spondylisthesis     Past Surgical History:  Procedure Laterality Date  . FINGER SURGERY  1980's   Thumb   . FRACTURE SURGERY  1990   Nose  . HERNIA REPAIR    . SHOULDER SURGERY  2013   Dr, [place]    Prior to Admission medications   Medication Sig Start Date End Date Taking? Authorizing Provider  acetaminophen (TYLENOL) 325 MG tablet Take 325 mg by mouth every 6 (six) hours as needed.   Yes [provider]  aspirin 325 MG tablet Take 325 mg by mouth daily.   Yes [provider]  busPIRone (BUSPAR) 15 MG tablet Take 1 tablet (15 mg total) by mouth 3 (three) times daily. 05/13/17  Yes [name], MD  cetirizine (ZYRTEC) 10 MG tablet Take 10 mg by mouth daily as needed.    Yes [provider]  cyclobenzaprine (FLEXERIL) 10 MG tablet Take 1 tablet (10 mg total) by mouth 3 (three) times daily as needed for muscle spasms. 08/10/16  Yes [name], MD  esomeprazole (NEXIUM) 20 MG capsule Take 20 mg by mouth daily  at 12 noon.   Yes [provider]  famotidine (PEPCID) 10 MG tablet Take 10 mg by mouth 2 (two) times daily.   Yes [provider]  ibuprofen (ADVIL,MOTRIN) 100 MG/5ML suspension Take 200 mg by mouth every 4 (four) hours as needed.   Yes [provider]  Multiple Vitamin (MULTI-VITAMINS) TABS Take by mouth.   Yes [provider]  naproxen (NAPROSYN) 125 MG/5ML suspension Take 250 mg by mouth 2 (two) times daily as needed.    Yes [provider]  Omega-3 Fatty Acids (FISH OIL) 1000 MG CAPS Take by mouth.   Yes [provider]  sertraline (ZOLOFT) 100 MG tablet TAKE 1 TABLET (100 MG TOTAL) BY MOUTH DAILY. 05/28/17  Yes [provider]  buPROPion (WELLBUTRIN XL) 150 MG 24 hr tablet Take 1 tablet (150 mg total) by mouth daily. Patient not taking: Reported on 06/24/2017 05/13/17   [name], MD  linaclotide  Rolan Lipa) 72 MCG capsule Take 1 capsule (72 mcg total) by mouth daily before breakfast. Patient not taking: Reported on 06/24/2017 05/10/16   [name], MD  Probiotic Product (PROBIOTIC DAILY PO) Take 1 capsule by mouth 3 (three) times daily.    [provider]  Simethicone 180 MG CAPS Take by mouth 2 (two) times daily.    [provider]  TURMERIC PO Take 2 capsules by mouth daily.    [provider]    Family History  Problem Relation Age of Onset  . CVA Father   . Alcohol abuse Father      Social History  Substance Use Topics  . Smoking status: Former Smoker    Packs/day: 1.00    Types: Cigarettes    Start date: 08/02/1993    Quit date: 06/02/2013  . Smokeless tobacco: Current User     Comment: Vapes for the past 2 years   . Alcohol use 0.0 oz/week     Comment: Occasional    Allergies as of 06/24/2017  . (No Known Allergies)    Review of Systems:    All systems reviewed and negative except where noted in HPI.   Physical Exam:  BP 118/69   Pulse 80   Temp 98.2 F (36.8 C) (Oral)   Ht 6\' 3"  (1.905 m)   Wt 198 lb (89.8 kg)   BMI 24.75 kg/m  No LMP for male patient. Psych:  Alert and cooperative. Normal mood and affect. General:   Alert,  Well-developed, well-nourished, pleasant and cooperative in NAD Head:  Normocephalic and atraumatic. Eyes:  Sclera clear, no icterus.   Conjunctiva pink. Ears:  Normal auditory acuity. Nose:  No deformity, discharge, or lesions. Mouth:  No deformity or lesions,oropharynx pink & moist. Neck:  Supple; no masses or thyromegaly. Lungs:  Respirations even and unlabored.  Clear throughout to auscultation.   No wheezes, crackles, or rhonchi. No acute distress. Heart:  Regular rate and rhythm; no murmurs, clicks, rubs, or gallops. Abdomen:  Normal bowel sounds.  No bruits.  Soft, non-tender and non-distended without masses, hepatosplenomegaly or hernias noted.  No guarding or rebound tenderness.  Negative  Carnett sign.   Rectal:  Deferred.  Msk:  Symmetrical without gross deformities.  Good, equal movement & strength bilaterally. Pulses:  Normal pulses noted. Extremities:  No clubbing or edema.  No cyanosis. Neurologic:  Alert and oriented x3;  grossly normal neurologically. Skin:  Intact without significant lesions or rashes.  No jaundice. Lymph Nodes:  No significant cervical adenopathy. Psych:  Alert and cooperative. Normal mood and affect.  Imaging Studies: No results found.  Assessment and Plan:   Jadarious Dobbins is a 46 y.o. y/o male Who has a history of a change in bowel habits after having surgery for hernia repair.  The patient has had a complete workup by Dr. Candace Cruise and multiple imaging of his abdomen without any abnormality found.  The patient has also had multiple colonoscopies.  The patient likely has a form of irritable bowel syndrome which may have been brought on by antibiotics around his hernia surgery or if he had taken a purge around the same time.  The patient has been explained this and that since he is doing well on the Citrucel and Senokot he should continue that.  The patient will also be started on VSL #3 probiotic to see if this helps his symptoms.  The patient also reports that he suffers from anxiety and depression is being treated for that. The patient has been explained the diagnosis of irritable bowel syndrome and its chronicity.  The patient states he understands the plan and that no further imaging or procedures will be needed at this time.  He will try the probiotics and keep doing what he has to keep his bowels moving.  Lucilla Lame, MD. Marval Regal   Note: This dictation was prepared with Dragon dictation along with smaller phrase technology. Any transcriptional errors that result from this process are unintentional.

## 2017-06-26 NOTE — Telephone Encounter (Signed)
Error

## 2017-08-02 ENCOUNTER — Other Ambulatory Visit: Payer: Self-pay | Admitting: *Deleted

## 2017-08-02 MED ORDER — BUSPIRONE HCL 7.5 MG PO TABS: 15.0000 mg | ORAL_TABLET | Freq: Three times a day (TID) | ORAL | 1 refills | Status: DC

## 2017-08-02 NOTE — Telephone Encounter (Signed)
Pharmacy requested the 7.5 mg tablets because the 15 mg tablets are on backorder until further notice. New Rx sent in electronically.

## 2017-08-12 ENCOUNTER — Telehealth: Payer: Self-pay

## 2017-08-12 NOTE — Telephone Encounter (Signed)
Error

## 2017-08-21 ENCOUNTER — Telehealth: Payer: Self-pay

## 2017-08-21 ENCOUNTER — Other Ambulatory Visit: Payer: Self-pay | Admitting: Internal Medicine

## 2017-08-21 MED ORDER — BUSPIRONE HCL 30 MG PO TABS: 15.0000 mg | ORAL_TABLET | Freq: Three times a day (TID) | ORAL | 3 refills | Status: DC

## 2017-08-21 NOTE — Telephone Encounter (Signed)
Received a fax from pharmacy stating that they can not get the 7.5mg  buspirone right now. They are wanting to know if it is okay to send in the 30mg  tablet instead.

## 2017-08-21 NOTE — Telephone Encounter (Signed)
His intended dose is 15 mg  up to 3 times  daily .  I assume they didn't have a 15 mg tablet either?  Sent 30 mg tablet rx with directions to  take 1/2 tablet

## 2017-11-13 ENCOUNTER — Ambulatory Visit: Payer: BLUE CROSS/BLUE SHIELD | Admitting: Internal Medicine

## 2017-11-13 VITALS — BP 122/84 | HR 85 | Temp 98.0°F | Resp 15 | Ht 75.0 in | Wt 192.4 lb

## 2017-11-13 DIAGNOSIS — F411 Generalized anxiety disorder: Secondary | ICD-10-CM | POA: Diagnosis not present

## 2017-11-13 DIAGNOSIS — R61 Generalized hyperhidrosis: Secondary | ICD-10-CM

## 2017-11-13 DIAGNOSIS — K581 Irritable bowel syndrome with constipation: Secondary | ICD-10-CM | POA: Diagnosis not present

## 2017-11-13 DIAGNOSIS — R109 Unspecified abdominal pain: Secondary | ICD-10-CM

## 2017-11-13 LAB — POCT URINALYSIS DIPSTICK
BILIRUBIN UA: NEGATIVE
Blood, UA: NEGATIVE
Glucose, UA: NEGATIVE
Ketones, UA: NEGATIVE
Leukocytes, UA: NEGATIVE
Nitrite, UA: NEGATIVE
PH UA: 5 (ref 5.0–8.0)
PROTEIN UA: NEGATIVE
Spec Grav, UA: <=1.005 — AB (ref 1.010–1.025)
UROBILINOGEN UA: 0.2 U/dL

## 2017-11-13 LAB — COMPREHENSIVE METABOLIC PANEL
ALK PHOS: 59 U/L (ref 39–117)
ALT: 34 U/L (ref 0–53)
AST: 22 U/L (ref 0–37)
Albumin: 5.5 g/dL — ABNORMAL HIGH (ref 3.5–5.2)
BUN: 16 mg/dL (ref 6–23)
CALCIUM: 10.2 mg/dL (ref 8.4–10.5)
CHLORIDE: 102 meq/L (ref 96–112)
CO2: 29 mEq/L (ref 19–32)
Creatinine, Ser: 0.91 mg/dL (ref 0.40–1.50)
GFR: 95.65 mL/min (ref >60.00)
Glucose, Bld: 95 mg/dL (ref 70–99)
POTASSIUM: 3.9 meq/L (ref 3.5–5.1)
Sodium: 139 mEq/L (ref 135–145)
Total Bilirubin: 0.6 mg/dL (ref 0.2–1.2)
Total Protein: 8.7 g/dL — ABNORMAL HIGH (ref 6.0–8.3)

## 2017-11-13 LAB — CBC WITH DIFFERENTIAL/PLATELET
BASOS ABS: 0 10*3/uL (ref 0.0–0.1)
BASOS PCT: 0.5 % (ref 0.0–3.0)
EOS PCT: 2.7 % (ref 0.0–5.0)
Eosinophils Absolute: 0.2 10*3/uL (ref 0.0–0.7)
HEMATOCRIT: 42.8 % (ref 39.0–52.0)
Hemoglobin: 14.7 g/dL (ref 13.0–17.0)
LYMPHS PCT: 50.6 % — AB (ref 12.0–46.0)
Lymphs Abs: 3.5 10*3/uL (ref 0.7–4.0)
MCHC: 34.4 g/dL (ref 30.0–36.0)
MCV: 90.9 fl (ref 78.0–100.0)
MONOS PCT: 7.7 % (ref 3.0–12.0)
Monocytes Absolute: 0.5 10*3/uL (ref 0.1–1.0)
NEUTROS ABS: 2.7 10*3/uL (ref 1.4–7.7)
Neutrophils Relative %: 38.5 % — ABNORMAL LOW (ref 43.0–77.0)
PLATELETS: 314 10*3/uL (ref 150.0–400.0)
RBC: 4.71 Mil/uL (ref 4.22–5.81)
RDW: 12.4 % (ref 11.5–15.5)
WBC: 7 10*3/uL (ref 4.0–10.5)

## 2017-11-13 LAB — POCT CBG (FASTING - GLUCOSE)-MANUAL ENTRY: Glucose Fasting, POC: 91 mg/dL (ref 70–99)

## 2017-11-13 NOTE — Progress Notes (Signed)
Subjective:  Patient ID: Gary Mccullough, male    DOB: 1970/12/07  Age: 47 y.o. MRN: [id number] .  Right hadn injry playing gold  Has a n CC: The primary encounter diagnosis was Diaphoresis. Diagnoses of GAD (generalized anxiety disorder), Irritable bowel syndrome with constipation, and Functional abdominal pain syndrome were also pertinent to this visit.  HPI Gary Mccullough presents for follow up on GAD and IBS accompanied by insomnia.  Last seen in August.  Referred to Dr Allen Norris for persistent IBS issues and Linzess failure.  Seen in September and diagnosis of IBS C confirmed. Trial of  Probiotic VSL #3 rwas recommended.    Patient did not try the VSL.  Stopped the zoloft because it did not help,  And gave him a daily headache. Has been using Citrucel daily and sennakot as needed. Still losing weight because "it hurts when I eat"  Denies cramping.  The pain feels  like pressure as "undigested food moves through my intestines"   GAD.  Denies having daily nxiety ,   Has rare panic attacks on the average on once a month. History of xanax addiction,  Does not want to use any benzo's.  Buspirone made him dizzy when he restarted it.  Wellbutrin was started for management of fatigue, lack of motivation . Made him more anxious .  Occasional insomnia but prefers not to take anything,  Not falling asleep during the day   Playing golf for exercise  Daily    Outpatient Medications Prior to Visit  Medication Sig Dispense Refill  . acetaminophen (TYLENOL) 325 MG tablet Take 325 mg by mouth every 6 (six) hours as needed.    [name] aspirin 325 MG tablet Take 325 mg by mouth daily.    . cetirizine (ZYRTEC) 10 MG tablet Take 10 mg by mouth daily as needed.     . cyclobenzaprine (FLEXERIL) 10 MG tablet Take 1 tablet (10 mg total) by mouth 3 (three) times daily as needed for muscle spasms. 30 tablet 5  . esomeprazole (NEXIUM) 20 MG capsule Take 20 mg by mouth daily at 12 noon.    . famotidine (PEPCID) 10 MG  tablet Take 10 mg by mouth 2 (two) times daily.    [name] ibuprofen (ADVIL,MOTRIN) 100 MG/5ML suspension Take 200 mg by mouth every 4 (four) hours as needed.    . Multiple Vitamin (MULTI-VITAMINS) TABS Take by mouth.    . Omega-3 Fatty Acids (FISH OIL) 1000 MG CAPS Take by mouth.    . Probiotic Product (PROBIOTIC DAILY PO) Take 1 capsule by mouth 3 (three) times daily.    . Simethicone 180 MG CAPS Take by mouth 2 (two) times daily.    . TURMERIC PO Take 2 capsules by mouth daily.    . busPIRone (BUSPAR) 30 MG tablet Take 0.5 tablets (15 mg total) by mouth 3 (three) times daily. (Patient taking differently: Take 15 mg by mouth 3 (three) times daily. ) 45 tablet 3  . buPROPion (WELLBUTRIN XL) 150 MG 24 hr tablet Take 1 tablet (150 mg total) by mouth daily. (Patient not taking: Reported on 06/24/2017) 30 tablet 2  . linaclotide (LINZESS) 72 MCG capsule Take 1 capsule (72 mcg total) by mouth daily before breakfast. (Patient not taking: Reported on 06/24/2017) 30 capsule 5  . naproxen (NAPROSYN) 125 MG/5ML suspension Take 250 mg by mouth 2 (two) times daily as needed.     . sertraline (ZOLOFT) 100 MG tablet TAKE 1 TABLET (100 MG TOTAL) BY  MOUTH DAILY.  2   No facility-administered medications prior to visit.     Review of Systems;  Patient denies headache, fevers, malaise, , skin rash, eye pain, sinus congestion and sinus pain, sore throat, dysphagia,  hemoptysis , cough, dyspnea, wheezing, chest pain, palpitations, orthopnea, edema, , nausea, melena, diarrhea, , flank pain, dysuria, hematuria, urinary  Frequency, nocturia, numbness, tingling, seizures,  Focal weakness, Loss of consciousness,  Tremor, insomnia, depression, anxiety, and suicidal ideation.      Objective:  BP 122/84 (BP Location: Left Arm, Patient Position: Sitting, Cuff Size: Normal)   Pulse 85   Temp 98 F (36.7 C) (Oral)   Resp 15   Ht 6\' 3"  (1.905 m)   Wt 192 lb 6.4 oz (87.3 kg)   SpO2 98%   BMI 24.05 kg/m   BP Readings from  Last 3 Encounters:  11/13/17 122/84  06/24/17 118/69  05/13/17 116/72    Wt Readings from Last 3 Encounters:  11/13/17 192 lb 6.4 oz (87.3 kg)  06/24/17 198 lb (89.8 kg)  05/13/17 198 lb 12.8 oz (90.2 kg)    General appearance: alert, cooperative and appears stated age Ears: normal TM's and external ear canals both ears Throat: lips, mucosa, and tongue normal; teeth and gums normal Neck: no adenopathy, no carotid bruit, supple, symmetrical, trachea midline and thyroid not enlarged, symmetric, no tenderness/mass/nodules Back: symmetric, no curvature. ROM normal. No CVA tenderness. Lungs: clear to auscultation bilaterally Heart: regular rate and rhythm, S1, S2 normal, no murmur, click, rub or gallop Abdomen: soft, non-tender; bowel sounds normal; no masses,  no organomegaly Pulses: 2+ and symmetric Skin: Skin color, texture, turgor normal. No rashes or lesions Lymph nodes: Cervical, supraclavicular, and axillary nodes normal.  Lab Results  Component Value Date   HGBA1C 5.4 01/25/2016    Lab Results  Component Value Date   CREATININE 0.91 11/13/2017   CREATININE 0.79 05/13/2017   CREATININE 0.75 08/10/2016    Lab Results  Component Value Date   WBC 7.0 11/13/2017   HGB 14.7 11/13/2017   HCT 42.8 11/13/2017   PLT 314.0 11/13/2017   GLUCOSE 95 11/13/2017   CHOL 165 01/25/2016   TRIG 88.0 01/25/2016   HDL 47.10 01/25/2016   LDLCALC 100 (H) 01/25/2016   ALT 34 11/13/2017   AST 22 11/13/2017   NA 139 11/13/2017   K 3.9 11/13/2017   CL 102 11/13/2017   CREATININE 0.91 11/13/2017   BUN 16 11/13/2017   CO2 29 11/13/2017   TSH 1.53 05/13/2017   HGBA1C 5.4 01/25/2016    Ct Abdomen Pelvis W Contrast  Result Date: 09/29/2015 CLINICAL DATA:  Right periumbilical area pain. Prior right inguinal hernia repair. EXAM: CT ABDOMEN AND PELVIS WITH CONTRAST TECHNIQUE: Multidetector CT imaging of the abdomen and pelvis was performed using the standard protocol following bolus  administration of intravenous contrast. CONTRAST:  136mL OMNIPAQUE IOHEXOL 350 MG/ML SOLN COMPARISON:  06/22/2014 FINDINGS: Lung bases are clear.  No effusions.  Heart is normal size. Liver, gallbladder, spleen, pancreas, adrenals kidneys unremarkable. Small exophytic cyst off the upper pole of the left kidney which is stable. No hydronephrosis. Moderate stool burden throughout the colon. Normal appendix. The stomach and small bowel are decompressed and unremarkable. Aorta is normal caliber. No evidence of inguinal or umbilical hernia. No abdominal wall abnormality. IMPRESSION: Moderate stool burden in the colon. No acute findings in the abdomen or pelvis. Electronically Signed   By: Rolm Baptise M.D.   On: 09/29/2015 11:00  Assessment & Plan:   Problem List Items Addressed This Visit    Functional abdominal pain syndrome    He is losing weight again due to avoidance of eating due to post prandial pain .  Need to consider alternative diagnoses including mesenteric ischemia from median arcuate ligament syndrome.       Relevant Orders   Ambulatory referral to Vascular Surgery   Irritable bowel syndrome (IBS)    With slow transit constipation and bloating as primary symptoms. Has not tolerated Linzess on multiple trials due to diarrhea and resulting dehydration. Reporting that stools have been small caliber since July . Second opinion from Dr Allen Norris failed to yield a satisfactory result for patient and he has continued to use citrucel daily and prn sennakot.       GAD (generalized anxiety disorder)    He has  Decided to forego medications for the time being      Diaphoresis - Primary    Patient became suddenly diaphoretic during office visit.  UA, labs were unrevealing.   Lab Results  Component Value Date   WBC 7.0 11/13/2017   HGB 14.7 11/13/2017   HCT 42.8 11/13/2017   MCV 90.9 11/13/2017   PLT 314.0 11/13/2017         Relevant Orders   CBC with Differential/Platelet (Completed)    POCT CBG (Fasting - Glucose) (Completed)   POCT urinalysis dipstick (Completed)   Comprehensive metabolic panel (Completed)    A total of 40 minutes was spent with patient more than half of which was spent in counseling patient on the above mentioned issues , reviewing and explaining recent labs and imaging studies done, and coordination of care.  I have discontinued Myna Bright linaclotide, naproxen, buPROPion, sertraline, and busPIRone. I am also having him maintain his MULTI-VITAMINS, Fish Oil, TURMERIC PO, cetirizine, Simethicone, ibuprofen, cyclobenzaprine, Probiotic Product (PROBIOTIC DAILY PO), esomeprazole, famotidine, aspirin, and acetaminophen.  No orders of the defined types were placed in this encounter.   Medications Discontinued During This Encounter  Medication Reason  . buPROPion (WELLBUTRIN XL) 150 MG 24 hr tablet Patient has not taken in last 30 days  . naproxen (NAPROSYN) 125 MG/5ML suspension Prescription never filled  . sertraline (ZOLOFT) 100 MG tablet Patient has not taken in last 30 days  . busPIRone (BUSPAR) 30 MG tablet   . linaclotide (LINZESS) 72 MCG capsule     Follow-up: No Follow-up on file.   [name], MD

## 2017-11-13 NOTE — Patient Instructions (Addendum)
I agree with you that a trial without medications is a good idea  Continue regular exercise for general health and management of stress   If you would like to retry Bentyl (dicyclomine) before meals to see if it helps the abdominal pain,  Let me know . If the weight loss continues,  I recommend a vascular evaluation to rule out any circulation issues that may be affecting your intestines

## 2017-11-14 DIAGNOSIS — R61 Generalized hyperhidrosis: Secondary | ICD-10-CM | POA: Insufficient documentation

## 2017-11-14 NOTE — Assessment & Plan Note (Signed)
He has  Decided to forego medications for the time being

## 2017-11-14 NOTE — Assessment & Plan Note (Signed)
With slow transit constipation and bloating as primary symptoms. Has not tolerated Linzess on multiple trials due to diarrhea and resulting dehydration. Reporting that stools have been small caliber since July . Second opinion from Dr Allen Norris failed to yield a satisfactory result for patient and he has continued to use citrucel daily and prn sennakot.

## 2017-11-14 NOTE — Assessment & Plan Note (Addendum)
Patient became suddenly diaphoretic during office visit.  UA, labs were unrevealing.   Lab Results  Component Value Date   WBC 7.0 11/13/2017   HGB 14.7 11/13/2017   HCT 42.8 11/13/2017   MCV 90.9 11/13/2017   PLT 314.0 11/13/2017

## 2017-11-14 NOTE — Assessment & Plan Note (Signed)
He is losing weight again due to avoidance of eating due to post prandial pain .  Need to consider alternative diagnoses including mesenteric ischemia from median arcuate ligament syndrome.

## 2018-11-14 ENCOUNTER — Encounter: Payer: Self-pay | Admitting: Internal Medicine

## 2018-11-14 ENCOUNTER — Ambulatory Visit (INDEPENDENT_AMBULATORY_CARE_PROVIDER_SITE_OTHER): Payer: BLUE CROSS/BLUE SHIELD | Admitting: Internal Medicine

## 2018-11-14 VITALS — BP 136/82 | HR 96 | Temp 98.1°F | Resp 14 | Ht 75.0 in | Wt 198.0 lb

## 2018-11-14 DIAGNOSIS — Z0000 Encounter for general adult medical examination without abnormal findings: Secondary | ICD-10-CM | POA: Diagnosis not present

## 2018-11-14 DIAGNOSIS — R109 Unspecified abdominal pain: Secondary | ICD-10-CM

## 2018-11-14 DIAGNOSIS — F32A Depression, unspecified: Secondary | ICD-10-CM

## 2018-11-14 DIAGNOSIS — K219 Gastro-esophageal reflux disease without esophagitis: Secondary | ICD-10-CM

## 2018-11-14 DIAGNOSIS — R5383 Other fatigue: Secondary | ICD-10-CM

## 2018-11-14 DIAGNOSIS — D2339 Other benign neoplasm of skin of other parts of face: Secondary | ICD-10-CM

## 2018-11-14 DIAGNOSIS — F329 Major depressive disorder, single episode, unspecified: Secondary | ICD-10-CM | POA: Diagnosis not present

## 2018-11-14 DIAGNOSIS — R030 Elevated blood-pressure reading, without diagnosis of hypertension: Secondary | ICD-10-CM

## 2018-11-14 DIAGNOSIS — K760 Fatty (change of) liver, not elsewhere classified: Secondary | ICD-10-CM

## 2018-11-14 DIAGNOSIS — K581 Irritable bowel syndrome with constipation: Secondary | ICD-10-CM

## 2018-11-14 LAB — COMPREHENSIVE METABOLIC PANEL
ALBUMIN: 5.3 g/dL — AB (ref 3.5–5.2)
ALT: 54 U/L — ABNORMAL HIGH (ref 0–53)
AST: 26 U/L (ref 0–37)
Alkaline Phosphatase: 70 U/L (ref 39–117)
BUN: 14 mg/dL (ref 6–23)
CALCIUM: 9.9 mg/dL (ref 8.4–10.5)
CO2: 29 mEq/L (ref 19–32)
Chloride: 102 mEq/L (ref 96–112)
Creatinine, Ser: 0.88 mg/dL (ref 0.40–1.50)
GFR: 93.13 mL/min (ref >60.00)
Glucose, Bld: 99 mg/dL (ref 70–99)
Potassium: 4 mEq/L (ref 3.5–5.1)
Sodium: 139 mEq/L (ref 135–145)
Total Bilirubin: 0.5 mg/dL (ref 0.2–1.2)
Total Protein: 8.2 g/dL (ref 6.0–8.3)

## 2018-11-14 LAB — TSH: TSH: 1.35 u[IU]/mL (ref 0.35–4.50)

## 2018-11-14 LAB — CBC WITH DIFFERENTIAL/PLATELET
Basophils Absolute: 0 10*3/uL (ref 0.0–0.1)
Basophils Relative: 0.5 % (ref 0.0–3.0)
Eosinophils Absolute: 0.1 10*3/uL (ref 0.0–0.7)
Eosinophils Relative: 2.1 % (ref 0.0–5.0)
HCT: 42.9 % (ref 39.0–52.0)
Hemoglobin: 14.7 g/dL (ref 13.0–17.0)
Lymphocytes Relative: 45.3 % (ref 12.0–46.0)
Lymphs Abs: 3 10*3/uL (ref 0.7–4.0)
MCHC: 34.2 g/dL (ref 30.0–36.0)
MCV: 90.7 fl (ref 78.0–100.0)
Monocytes Absolute: 0.5 10*3/uL (ref 0.1–1.0)
Monocytes Relative: 8.1 % (ref 3.0–12.0)
Neutro Abs: 3 10*3/uL (ref 1.4–7.7)
Neutrophils Relative %: 44 % (ref 43.0–77.0)
Platelets: 346 10*3/uL (ref 150.0–400.0)
RBC: 4.73 Mil/uL (ref 4.22–5.81)
RDW: 12.8 % (ref 11.5–15.5)
WBC: 6.7 10*3/uL (ref 4.0–10.5)

## 2018-11-14 NOTE — Progress Notes (Signed)
Patient ID: Gary Mccullough, male    DOB: 09/08/1971  Age: 48 y.o. MRN: [id number]  The patient is here for annual  preventive examination and management of other chronic and acute problems.   The risk factors are reflected in the social history.  The roster of all physicians providing medical care to patient - is listed in the Snapshot section of the chart.  Activities of daily living:  The patient is 100% independent in all ADLs: dressing, toileting, feeding as well as independent mobility  Home safety : The patient has smoke detectors in the home. They wear seatbelts.  There are no firearms at home. There is no violence in the home.   There is no risks for hepatitis, STDs or HIV. There is no   history of blood transfusion. They have no travel history to infectious disease endemic areas of the world.  The patient sees his dentist annually .  He has seen his  eye doctor in the last year. H denies  hearing difficulty with regard to whispered voices and some television programs.   They do not  have excessive sun exposure. Discussed the need for sun protection: hats, long sleeves and use of sunscreen if there is significant sun exposure.   Diet: the importance of a healthy diet is discussed. He does have a healthy diet. The benefits of regular aerobic exercise were discussed. He plays disc golf 3  per week ,  90 minutes.   Depression screen: there are no signs or vegative symptoms of depression- irritability, change in appetite, anhedonia, sadness/tearfullness.  The following portions of the patient's history were reviewed and updated as appropriate: allergies, current medications, past family history, past medical history,  past surgical history, past social history  and problem list.  Visual acuity was not assessed per patient preference since she has regular follow up with her ophthalmologist. Hearing and body mass index were assessed and reviewed.   During the course of the visit the  patient was educated and counseled about appropriate screening and preventive services including : fall prevention , diabetes screening, nutrition counseling, colorectal cancer screening, and recommended immunizations.    CC: The primary encounter diagnosis was Fatigue due to depression. Diagnoses of Benign neoplasm skin of temple, Functional abdominal pain syndrome, Fatty infiltration of liver, Irritable bowel syndrome with constipation, Elevated blood-pressure reading without diagnosis of hypertension, Gastroesophageal reflux disease without esophagitis, and Encounter for preventive health examination were also pertinent to this visit.   IBS: symptoms have   improved drinking  8 ounces daily of a non caffeinated  herbal tea mix .which has relieved the gas and bloating that has negatively affected his appetite for the past 2 years .  He has gained weight and reports that his acid reflux resolved. He is no longer constipated  since drinking  (" MY Miracle Tea") .   Sleep habits have improved.    Some decreased energy.    Still playing  36  holes of disco golf regularly , denies shortness of breath   [name] has a past medical history of Alcohol abuse, Allergy, Cervical stenosis of spine, Chicken pox, Fatty liver, Liver hemangioma, Seizures (Gary Mccullough), and Spondylisthesis.   He has a past surgical history that includes Hernia repair; Fracture surgery (1990); Shoulder surgery (2013); and Finger surgery (1980's).   His family history includes Alcohol abuse in his father; CVA in his father; Gout in his brother.He reports that he quit smoking about 5 years ago. His smoking use  included cigarettes. He started smoking about 25 years ago. He smoked 1.00 pack per day. He uses smokeless tobacco. He reports current alcohol use. He reports that he does not use drugs.  Outpatient Medications Prior to Visit  Medication Sig Dispense Refill  . acetaminophen (TYLENOL) 325 MG tablet Take 325 mg by mouth every 6  (six) hours as needed.    [name] aspirin 325 MG tablet Take 325 mg by mouth daily.    . cetirizine (ZYRTEC) 10 MG tablet Take 10 mg by mouth daily as needed.     . cyclobenzaprine (FLEXERIL) 10 MG tablet Take 1 tablet (10 mg total) by mouth 3 (three) times daily as needed for muscle spasms. 30 tablet 5  . famotidine (PEPCID) 10 MG tablet Take 10 mg by mouth 2 (two) times daily.    . Glucosamine-Chondroitin (GLUCOSAMINE CHONDR COMPLEX PO) Take 1 capsule by mouth daily.    [name] ibuprofen (ADVIL,MOTRIN) 100 MG/5ML suspension Take 200 mg by mouth every 4 (four) hours as needed.    . Multiple Vitamin (MULTI-VITAMINS) TABS Take by mouth.    . Omega-3 Fatty Acids (FISH OIL) 1000 MG CAPS Take by mouth.    . Probiotic Product (PROBIOTIC DAILY PO) Take 1 capsule by mouth 3 (three) times daily.    . TURMERIC PO Take 2 capsules by mouth daily.    [name] esomeprazole (NEXIUM) 20 MG capsule Take 20 mg by mouth daily at 12 noon.    . Simethicone 180 MG CAPS Take by mouth 2 (two) times daily.     No facility-administered medications prior to visit.     Review of Systems   Patient denies headache, fevers, malaise, unintentional weight loss, skin rash, eye pain, sinus congestion and sinus pain, sore throat, dysphagia,  hemoptysis , cough, dyspnea, wheezing, chest pain, palpitations, orthopnea, edema, abdominal pain, nausea, melena, diarrhea, constipation, flank pain, dysuria, hematuria, urinary  Frequency, nocturia, numbness, tingling, seizures,  Focal weakness, Loss of consciousness,  Tremor, insomnia, depression, anxiety, and suicidal ideation.      Objective:  BP 136/82 (BP Location: Left Arm, Patient Position: Sitting, Cuff Size: Normal)   Pulse 96   Temp 98.1 F (36.7 C) (Oral)   Resp 14   Ht 6\' 3"  (1.905 m)   Wt 198 lb (89.8 kg)   SpO2 96%   BMI 24.75 kg/m   Physical Exam   General appearance: alert, cooperative and appears stated age Ears: normal TM's and external ear canals both ears Throat: lips,  mucosa, and tongue normal; teeth and gums normal Neck: no adenopathy, no carotid bruit, supple, symmetrical, trachea midline and thyroid not enlarged, symmetric, no tenderness/mass/nodules Back: symmetric, no curvature. ROM normal. No CVA tenderness. Lungs: clear to auscultation bilaterally Heart: regular rate and rhythm, S1, S2 normal, no murmur, click, rub or gallop Abdomen: soft, non-tender; bowel sounds normal; no masses,  no organomegaly Pulses: 2+ and symmetric Skin: possible basal cell CA left forehead at hairline .  Skin texture, turgor normal. No rashes . Lymph nodes: Cervical, supraclavicular, and axillary nodes normal.   Assessment & Plan:   Problem List Items Addressed This Visit    Irritable bowel syndrome (IBS)    Managed with "My Miracle Tea"       Functional abdominal pain syndrome    Secondary to IBS with constipation predominance with chronic bloating and loss of appetite leading to uninentional weight loss .  All symptoms have resolved with daily ingestion of "My Miracle Tea",  An herbal tea.  Fatty infiltration of liver    Noted on prior imaging studies.  Prior history of alcohol abuse prior to 2014.  Recommending starting the Hepatitis s A and B vaccines. Series. He has continually deferred\.  ALT is mildly elevated.  Lab Results  Component Value Date   ALT 54 (H) 11/14/2018   AST 26 11/14/2018   ALKPHOS 70 11/14/2018   BILITOT 0.5 11/14/2018         Encounter for preventive health examination    age appropriate education and counseling updated, referrals for preventative services and immunizations addressed, dietary and smoking counseling addressed, most recent labs reviewed.  I have personally reviewed and have noted:  1) the patient's medical and social history 2) The pt's use of alcohol, tobacco, and illicit drugs 3) The patient's current medications and supplements 4) Functional ability including ADL's, fall risk, home safety risk, hearing  and visual impairment 5) Diet and physical activities 6) Evidence for depression or mood disorder 7) The patient's height, weight, and BMI have been recorded in the chart  I have made referrals, and provided counseling and education based on review of the above      Elevated blood-pressure reading without diagnosis of hypertension    Home readings for the past 2 weeks are consistently < 130/80,  With 75% under 120/70.       Acid reflux    Symptoms have resolved with use of herbal supplement       Other Visit Diagnoses    Fatigue due to depression    -  Primary   Relevant Orders   Comprehensive metabolic panel (Completed)   TSH (Completed)   CBC with Differential/Platelet (Completed)   Benign neoplasm skin of temple       Relevant Orders   Ambulatory referral to Dermatology      I have discontinued Jaidev W. Agent's Simethicone and esomeprazole. I am also having him maintain his MULTI-VITAMINS, Fish Oil, TURMERIC PO, cetirizine, ibuprofen, cyclobenzaprine, Probiotic Product (PROBIOTIC DAILY PO), famotidine, aspirin, acetaminophen, and Glucosamine-Chondroitin (GLUCOSAMINE CHONDR COMPLEX PO).  No orders of the defined types were placed in this encounter.   Medications Discontinued During This Encounter  Medication Reason  . esomeprazole (NEXIUM) 20 MG capsule Patient has not taken in last 30 days  . Simethicone 180 MG CAPS Patient has not taken in last 30 days    Follow-up: No follow-ups on file.   [name], MD

## 2018-11-14 NOTE — Patient Instructions (Signed)
I am making a referral to [facility] to look at the place on your temple/forehead  Labs today to rule out thyroid, anemia    See you in a year    Health Maintenance, Male A healthy lifestyle and preventive care is important for your health and wellness. Ask your health care provider about what schedule of regular examinations is right for you. What should I know about weight and diet? Eat a Healthy Diet  Eat plenty of vegetables, fruits, whole grains, low-fat dairy products, and lean protein.  Do not eat a lot of foods high in solid fats, added sugars, or salt.  Maintain a Healthy Weight Regular exercise can help you achieve or maintain a healthy weight. You should:  Do at least 150 minutes of exercise each week. The exercise should increase your heart rate and make you sweat (moderate-intensity exercise).  Do strength-training exercises at least twice a week. Watch Your Levels of Cholesterol and Blood Lipids  Have your blood tested for lipids and cholesterol every 5 years starting at 47 years of age. If you are at high risk for heart disease, you should start having your blood tested when you are 47 years old. You may need to have your cholesterol levels checked more often if: ? Your lipid or cholesterol levels are high. ? You are older than 47 years of age. ? You are at high risk for heart disease. What should I know about cancer screening? Many types of cancers can be detected early and may often be prevented. Lung Cancer  You should be screened every year for lung cancer if: ? You are a current smoker who has smoked for at least 30 years. ? You are a former smoker who has quit within the past 15 years.  Talk to your health care provider about your screening options, when you should start screening, and how often you should be screened. Colorectal Cancer  Routine colorectal cancer screening usually begins at 47 years of age and should be repeated every 5-10 years  until you are 47 years old. You may need to be screened more often if early forms of precancerous polyps or small growths are found. Your health care provider may recommend screening at an earlier age if you have risk factors for colon cancer.  Your health care provider may recommend using home test kits to check for hidden blood in the stool.  A small camera at the end of a tube can be used to examine your colon (sigmoidoscopy or colonoscopy). This checks for the earliest forms of colorectal cancer. Prostate and Testicular Cancer  Depending on your age and overall health, your health care provider may do certain tests to screen for prostate and testicular cancer.  Talk to your health care provider about any symptoms or concerns you have about testicular or prostate cancer. Skin Cancer  Check your skin from head to toe regularly.  Tell your health care provider about any new moles or changes in moles, especially if: ? There is a change in a mole's size, shape, or color. ? You have a mole that is larger than a pencil eraser.  Always use sunscreen. Apply sunscreen liberally and repeat throughout the day.  Protect yourself by wearing long sleeves, pants, a wide-brimmed hat, and sunglasses when outside. What should I know about heart disease, diabetes, and high blood pressure?  If you are 5-14 years of age, have your blood pressure checked every 3-5 years. If you are 40 years of  age or older, have your blood pressure checked every year. You should have your blood pressure measured twice-once when you are at a hospital or clinic, and once when you are not at a hospital or clinic. Record the average of the two measurements. To check your blood pressure when you are not at a hospital or clinic, you can use: ? An automated blood pressure machine at a pharmacy. ? A home blood pressure monitor.  Talk to your health care provider about your target blood pressure.  If you are between 54-79 years  old, ask your health care provider if you should take aspirin to prevent heart disease.  Have regular diabetes screenings by checking your fasting blood sugar level. ? If you are at a normal weight and have a low risk for diabetes, have this test once every three years after the age of 46. ? If you are overweight and have a high risk for diabetes, consider being tested at a younger age or more often.  A one-time screening for abdominal aortic aneurysm (AAA) by ultrasound is recommended for men aged 49-75 years who are current or former smokers. What should I know about preventing infection? Hepatitis B If you have a higher risk for hepatitis B, you should be screened for this virus. Talk with your health care provider to find out if you are at risk for hepatitis B infection. Hepatitis C Blood testing is recommended for:  Everyone born from [birth year] through 1964.  Anyone with known risk factors for hepatitis C. Sexually Transmitted Diseases (STDs)  You should be screened each year for STDs including gonorrhea and chlamydia if: ? You are sexually active and are younger than 47 years of age. ? You are older than 47 years of age and your health care provider tells you that you are at risk for this type of infection. ? Your sexual activity has changed since you were last screened and you are at an increased risk for chlamydia or gonorrhea. Ask your health care provider if you are at risk.  Talk with your health care provider about whether you are at high risk of being infected with HIV. Your health care provider may recommend a prescription medicine to help prevent HIV infection. What else can I do?  Schedule regular health, dental, and eye exams.  Stay current with your vaccines (immunizations).  Do not use any tobacco products, such as cigarettes, chewing tobacco, and e-cigarettes. If you need help quitting, ask your health care provider.  Limit alcohol intake to no more than 2 drinks per  day. One drink equals 12 ounces of beer, 5 ounces of wine, or 1 ounces of hard liquor.  Do not use street drugs.  Do not share needles.  Ask your health care provider for help if you need support or information about quitting drugs.  Tell your health care provider if you often feel depressed.  Tell your health care provider if you have ever been abused or do not feel safe at home. This information is not intended to replace advice given to you by your health care provider. Make sure you discuss any questions you have with your health care provider. Document Released: 03/15/2008 Document Revised: 05/16/2016 Document Reviewed: 06/21/2015 Elsevier Interactive Patient Education  2019 [employer].

## 2018-11-15 DIAGNOSIS — Z0001 Encounter for general adult medical examination with abnormal findings: Secondary | ICD-10-CM | POA: Insufficient documentation

## 2018-11-15 DIAGNOSIS — Z0000 Encounter for general adult medical examination without abnormal findings: Secondary | ICD-10-CM | POA: Insufficient documentation

## 2018-11-15 NOTE — Assessment & Plan Note (Signed)

## 2018-11-15 NOTE — Assessment & Plan Note (Signed)
Noted on prior imaging studies.  Prior history of alcohol abuse prior to 2014.  Recommending starting the Hepatitis s A and B vaccines. Series. He has continually deferred\.  ALT is mildly elevated.  Lab Results  Component Value Date   ALT 54 (H) 11/14/2018   AST 26 11/14/2018   ALKPHOS 70 11/14/2018   BILITOT 0.5 11/14/2018

## 2018-11-15 NOTE — Assessment & Plan Note (Signed)
Secondary to IBS with constipation predominance with chronic bloating and loss of appetite leading to uninentional weight loss .  All symptoms have resolved with daily ingestion of "My Miracle Tea",  An herbal tea.

## 2018-11-15 NOTE — Assessment & Plan Note (Signed)
Managed with "My Miracle Tea"

## 2018-11-15 NOTE — Assessment & Plan Note (Signed)
Home readings for the past 2 weeks are consistently < 130/80,  With 75% under 120/70.

## 2018-11-15 NOTE — Assessment & Plan Note (Signed)
Symptoms have resolved with use of herbal supplement

## 2018-11-24 ENCOUNTER — Other Ambulatory Visit: Payer: Self-pay

## 2018-11-24 ENCOUNTER — Ambulatory Visit
Admission: EM | Admit: 2018-11-24 | Discharge: 2018-11-24 | Disposition: A | Discharge status: Home / Self Care | Payer: BLUE CROSS/BLUE SHIELD | Attending: Family Medicine | Admitting: Family Medicine

## 2018-11-24 DIAGNOSIS — B9789 Other viral agents as the cause of diseases classified elsewhere: Secondary | ICD-10-CM

## 2018-11-24 DIAGNOSIS — J0100 Acute maxillary sinusitis, unspecified: Secondary | ICD-10-CM

## 2018-11-24 MED ORDER — AZITHROMYCIN 250 MG PO TABS: ORAL_TABLET | ORAL | 0 refills | Status: DC

## 2018-11-24 NOTE — ED Triage Notes (Signed)
Patient complains of that he has been having a fever since Saturday but reports cold like symptoms on Tuesday. Patient states that he feels like he has a sinus infection, sinus pain and pressure and ear pain.

## 2018-11-24 NOTE — ED Provider Notes (Signed)
[facility]    CSN: [id number] Arrival date & time: 11/24/18  1359     History   Chief Complaint Chief Complaint  Patient presents with  . Fever    HPI Gary Mccullough is a 48 y.o. male.   The history is provided by the patient.  Fever  Associated symptoms: congestion   URI  Presenting symptoms: congestion, facial pain and fever   Severity:  Moderate Onset quality:  Sudden Duration:  1 week Timing:  Constant Progression:  Worsening Chronicity:  New Relieved by:  Nothing Ineffective treatments:  OTC medications Associated symptoms: sinus pain   Risk factors: sick contacts     Past Medical History:  Diagnosis Date  . Alcohol abuse    Last Heavy Use 2014  . Allergy   . Cervical stenosis of spine   . Chicken pox   . Fatty liver   . Liver hemangioma   . Seizures ([place])   . Spondylisthesis     Patient Active Problem List   Diagnosis Date Noted  . Encounter for preventive health examination 11/15/2018  . Elevated blood-pressure reading without diagnosis of hypertension 11/15/2016  . GAD (generalized anxiety disorder) 08/12/2016  . Bleeding hemorrhoid 08/12/2016  . Spondylolysis of lumbar region 08/12/2016  . Benign liver cyst 08/12/2016  . Renal cyst, acquired, left 08/12/2016  . Elevated total protein 08/12/2016  . Hemangioma of liver 08/10/2016  . History of drug withdrawal syndrome 05/12/2016  . Irritable bowel syndrome (IBS) 01/27/2016  . 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 History of tobacco abuse 08/06/2015  . Allergic rhinitis 06/10/2014  . Cervical spinal stenosis 06/10/2014  . Acid reflux 06/10/2014  . Fatty infiltration of liver 06/10/2014    Past Surgical History:  Procedure Laterality Date  . FINGER SURGERY  1980's   Thumb   . FRACTURE SURGERY  1990   Nose  . HERNIA REPAIR    . SHOULDER SURGERY  2013   Dr, [place] Medications    Prior to Admission medications     Medication Sig Start Date End Date Taking? Authorizing Provider  acetaminophen (TYLENOL) 325 MG tablet Take 325 mg by mouth every 6 (six) hours as needed.   Yes [provider]  cetirizine (ZYRTEC) 10 MG tablet Take 10 mg by mouth daily as needed.    Yes [provider]  Glucosamine-Chondroitin (GLUCOSAMINE CHONDR COMPLEX PO) Take 1 capsule by mouth daily.   Yes [provider]  ibuprofen (ADVIL,MOTRIN) 100 MG/5ML suspension Take 200 mg by mouth every 4 (four) hours as needed.   Yes [provider]  Multiple Vitamin (MULTI-VITAMINS) TABS Take by mouth.   Yes [provider]  Omega-3 Fatty Acids (FISH OIL) 1000 MG CAPS Take by mouth.   Yes [provider]  Probiotic Product (PROBIOTIC DAILY PO) Take 1 capsule by mouth 3 (three) times daily.   Yes [provider]  TURMERIC PO Take 2 capsules by mouth daily.   Yes [provider]  aspirin 325 MG tablet Take 325 mg by mouth daily.    [provider]  azithromycin (ZITHROMAX Z-PAK) 250 MG tablet 2 tabs po once day 1, then 1 tab po qd for the next 4 days 11/24/18   Norval Gable, MD  cyclobenzaprine (FLEXERIL) 10 MG tablet Take 1 tablet (10 mg total) by mouth 3 (three) times daily as needed for muscle spasms. 08/10/16   [name], MD  famotidine (  PEPCID) 10 MG tablet Take 10 mg by mouth 2 (two) times daily.    [provider]    Family History Family History  Problem Relation Age of Onset  . CVA Father   . Alcohol abuse Father   . Gout Brother     Social History Social History   Tobacco Use  . Smoking status: Former Smoker    Packs/day: 1.00    Types: Cigarettes    Start date: 08/02/1993    Last attempt to quit: 06/02/2013    Years since quitting: 5.4  . Smokeless tobacco: Current User  . Tobacco comment: Vapes for the past 2 years   Substance Use Topics  . Alcohol use: Yes    Alcohol/week: 0.0 standard drinks    Comment: Occasional  . Drug  use: No     Allergies   Patient has no known allergies.   Review of Systems Review of Systems  Constitutional: Positive for fever.  HENT: Positive for congestion and sinus pain.      Physical Exam Triage Vital Signs ED Triage Vitals  Enc Vitals Group     BP 11/24/18 1430 (!) 141/91     Pulse Rate 11/24/18 1430 98     Resp 11/24/18 1430 16     Temp 11/24/18 1430 98.4 F (36.9 C)     Temp Source 11/24/18 1430 Oral     SpO2 11/24/18 1430 100 %     Weight 11/24/18 1428 198 lb (89.8 kg)     Height 11/24/18 1428 6\' 3"  (1.905 m)     Head Circumference --      Peak Flow --      Pain Score 11/24/18 1428 7     Pain Loc --      Pain Edu? --      Excl. in [place]? --    No data found.  Updated Vital Signs BP (!) 141/91 (BP Location: Right Arm)   Pulse 98   Temp 98.4 F (36.9 C) (Oral) Comment: taking tylenol and advil today  Resp 16   Ht 6\' 3"  (1.905 m)   Wt 89.8 kg   SpO2 100%   BMI 24.75 kg/m   Visual Acuity Right Eye Distance:   Left Eye Distance:   Bilateral Distance:    Right Eye Near:   Left Eye Near:    Bilateral Near:     Physical Exam Vitals signs and nursing note reviewed.  Constitutional:      General: He is not in acute distress.    Appearance: He is well-developed. He is not toxic-appearing or diaphoretic.  HENT:     Head: Normocephalic and atraumatic.     Right Ear: Tympanic membrane, ear canal and external ear normal.     Left Ear: Tympanic membrane, ear canal and external ear normal.     Nose:     Right Sinus: Maxillary sinus tenderness present.     Left Sinus: Maxillary sinus tenderness present.     Mouth/Throat:     Pharynx: Uvula midline. No oropharyngeal exudate.     Tonsils: No tonsillar abscesses.  Neck:     Musculoskeletal: Normal range of motion and neck supple.     Thyroid: No thyromegaly.     Trachea: No tracheal deviation.  Cardiovascular:     Rate and Rhythm: Normal rate and regular rhythm.     Heart sounds: Normal heart  sounds.  Pulmonary:     Effort: Pulmonary effort is normal. No respiratory distress.  Breath sounds: Normal breath sounds. No stridor. No wheezing, rhonchi or rales.  Chest:     Chest wall: No tenderness.  Lymphadenopathy:     Cervical: No cervical adenopathy.  Skin:    General: Skin is warm and dry.     Findings: No rash.  Neurological:     Mental Status: He is alert.      UC Treatments / Results  Labs (all labs ordered are listed, but only abnormal results are displayed) Labs Reviewed - No data to display  EKG None  Radiology No results found.  Procedures Procedures (including critical care time)  Medications Ordered in UC Medications - No data to display  Initial Impression / Assessment and Plan / UC Course  I have reviewed the triage vital signs and the nursing notes.  Pertinent labs & imaging results that were available during my care of the patient were reviewed by me and considered in my medical decision making (see chart for details).      Final Clinical Impressions(s) / UC Diagnoses   Final diagnoses:  Acute maxillary sinusitis, recurrence not specified    ED Prescriptions    Medication Sig Dispense Auth. Provider   azithromycin (ZITHROMAX Z-PAK) 250 MG tablet 2 tabs po once day 1, then 1 tab po qd for the next 4 days 6 each Norval Gable, MD     1. diagnosis reviewed with patient 2. rx as per orders above; reviewed possible side effects, interactions, risks and benefits  3. Recommend supportive treatment with otc flonase 4. Follow-up prn if symptoms worsen or don't improve   Controlled Substance Prescriptions [place] Controlled Substance Registry consulted? Not Applicable   Norval Gable, MD 11/24/18 [phone]

## 2018-11-25 ENCOUNTER — Ambulatory Visit: Payer: BLUE CROSS/BLUE SHIELD | Admitting: Family Medicine

## 2019-11-16 ENCOUNTER — Other Ambulatory Visit: Payer: Self-pay

## 2019-11-16 DIAGNOSIS — L738 Other specified follicular disorders: Secondary | ICD-10-CM | POA: Diagnosis not present

## 2019-11-16 DIAGNOSIS — L821 Other seborrheic keratosis: Secondary | ICD-10-CM | POA: Diagnosis not present

## 2019-11-16 DIAGNOSIS — D1801 Hemangioma of skin and subcutaneous tissue: Secondary | ICD-10-CM | POA: Diagnosis not present

## 2019-11-17 ENCOUNTER — Ambulatory Visit (INDEPENDENT_AMBULATORY_CARE_PROVIDER_SITE_OTHER): Payer: BC Managed Care – PPO | Admitting: Internal Medicine

## 2019-11-17 ENCOUNTER — Other Ambulatory Visit: Payer: Self-pay

## 2019-11-17 ENCOUNTER — Encounter: Payer: Self-pay | Admitting: Internal Medicine

## 2019-11-17 VITALS — BP 136/94 | HR 91 | Temp 98.1°F | Resp 15 | Ht 75.0 in | Wt 206.8 lb

## 2019-11-17 DIAGNOSIS — R5383 Other fatigue: Secondary | ICD-10-CM

## 2019-11-17 DIAGNOSIS — R030 Elevated blood-pressure reading, without diagnosis of hypertension: Secondary | ICD-10-CM

## 2019-11-17 DIAGNOSIS — E785 Hyperlipidemia, unspecified: Secondary | ICD-10-CM | POA: Diagnosis not present

## 2019-11-17 DIAGNOSIS — Z0000 Encounter for general adult medical examination without abnormal findings: Secondary | ICD-10-CM | POA: Diagnosis not present

## 2019-11-17 DIAGNOSIS — K760 Fatty (change of) liver, not elsewhere classified: Secondary | ICD-10-CM

## 2019-11-17 NOTE — Progress Notes (Signed)
Patient ID: Gary Mccullough, male    DOB: 01/26/1971  Age: 49 y.o. MRN: [id number]  The patient is here for annual preventive examination and management of other chronic and acute problems.  This visit occurred during the SARS-CoV-2 public health emergency.  Safety protocols were in place, including screening questions prior to the visit, additional usage of staff PPE, and extensive cleaning of exam room while observing appropriate contact time as indicated for disinfecting solutions.     The risk factors are reflected in the social history.  The roster of all physicians providing medical care to patient - is listed in the Snapshot section of the chart.  Activities of daily living:  The patient is 100% independent in all ADLs: dressing, toileting, feeding as well as independent mobility  Home safety : The patient has smoke detectors in the home. They wear seatbelts.  There are no firearms at home. There is no violence in the home.   There is no risks for hepatitis, STDs or HIV. There is no   history of blood transfusion. They have no travel history to infectious disease endemic areas of the world.  The patient has seen their dentist in the last six month. They have seen their eye doctor in the last year. They admit to slight hearing difficulty with regard to whispered voices and some television programs.  They have deferred audiologic testing in the last year.  They do not  have excessive sun exposure. Discussed the need for sun protection: hats, long sleeves and use of sunscreen if there is significant sun exposure.   Diet: the importance of a healthy diet is discussed. He golfs once or twice weekly  .   Depression screen: there are no signs or vegative symptoms of depression- irritability, change in appetite, anhedonia, sadness/tearfullness.  Cognitive assessment: the patient manages all their financial and personal affairs and is actively engaged. They could relate day,date,year and  events; recalled 2/3 objects at 3 minutes; performed clock-face test normally.  The following portions of the patient's history were reviewed and updated as appropriate: allergies, current medications, past family history, past medical history,  past surgical history, past social history  and problem list.  Visual acuity was not assessed per patient preference since she has regular follow up with her ophthalmologist. Hearing and body mass index were assessed and reviewed.   During the course of the visit the patient was educated and counseled about appropriate screening and preventive services including : fall prevention , diabetes screening, nutrition counseling, colorectal cancer screening, and recommended immunizations.    CC: The primary encounter diagnosis was Fatigue, unspecified type. Diagnoses of Hyperlipidemia LDL goal <160, Elevated blood-pressure reading without diagnosis of hypertension, Fatty infiltration of liver, White coat syndrome with high blood pressure without hypertension, and Encounter for preventive health examination were also pertinent to this visit.   49 yr old male with history of fatty liver,  Prior alcohol abuse,  Chronic low back pain,  IBS and hypertension  Presents for CPE with no specific complaint.s  Elevated blood pressure reading : patient checks blood pressure twice weekly at home with his wife's machine. .  Readings have been for the most part < 140/80 at rest . Patient is following a reduced salt diet most days and is taking medications as prescribed  History Garmon has a past medical history of Alcohol abuse, Allergy, Cervical stenosis of spine, Chicken pox, Fatty liver, Liver hemangioma, Seizures ([place]), and Spondylisthesis.   He has a past surgical  history that includes Hernia repair; Fracture surgery (1990); Shoulder surgery (2013); and Finger surgery (1980's).   His family history includes Alcohol abuse in his father; CVA in his father; Gout in his  brother.He reports that he quit smoking about 6 years ago. His smoking use included cigarettes. He started smoking about 26 years ago. He smoked 1.00 pack per day. He uses smokeless tobacco. He reports previous alcohol use. He reports that he does not use drugs.  Outpatient Medications Prior to Visit  Medication Sig Dispense Refill  . acetaminophen (TYLENOL) 325 MG tablet Take 325 mg by mouth every 6 (six) hours as needed.    [name] aspirin 325 MG tablet Take 325 mg by mouth every 6 (six) hours as needed.     . cetirizine (ZYRTEC) 10 MG tablet Take 10 mg by mouth daily as needed.     . famotidine (PEPCID) 10 MG tablet Take 10 mg by mouth 2 (two) times daily.    . Glucosamine-Chondroitin (GLUCOSAMINE CHONDR COMPLEX PO) Take 1 capsule by mouth daily.    [name] ibuprofen (ADVIL,MOTRIN) 100 MG/5ML suspension Take 200 mg by mouth every 4 (four) hours as needed.    . Multiple Vitamin (MULTI-VITAMINS) TABS Take by mouth.    . Omega-3 Fatty Acids (FISH OIL) 1000 MG CAPS Take by mouth.    . Probiotic Product (PROBIOTIC DAILY PO) Take 1 capsule by mouth 3 (three) times daily.    . TURMERIC PO Take 2 capsules by mouth daily.    [name] azithromycin (ZITHROMAX Z-PAK) 250 MG tablet 2 tabs po once day 1, then 1 tab po qd for the next 4 days (Patient not taking: Reported on 11/17/2019) 6 each 0  . cyclobenzaprine (FLEXERIL) 10 MG tablet Take 1 tablet (10 mg total) by mouth 3 (three) times daily as needed for muscle spasms. (Patient not taking: Reported on 11/17/2019) 30 tablet 5   No facility-administered medications prior to visit.  Social History:   reports that he quit smoking about 6 years ago. His smoking use included cigarettes. He started smoking about 26 years ago. He smoked 1.00 pack per day. He uses smokeless tobacco. He reports previous alcohol use. He reports that he does not use drugs.  Review of Systems   Patient denies headache, fevers, malaise, unintentional weight loss, skin rash, eye pain, sinus congestion  and sinus pain, sore throat, dysphagia,  hemoptysis , cough, dyspnea, wheezing, chest pain, palpitations, orthopnea, edema, abdominal pain, nausea, melena, diarrhea, constipation, flank pain, dysuria, hematuria, urinary  Frequency, nocturia, numbness, tingling, seizures,  Focal weakness, Loss of consciousness,  Tremor, insomnia, depression, anxiety, and suicidal ideation.      Objective:  BP (!) 136/94 (BP Location: Left Arm, Patient Position: Sitting, Cuff Size: Normal)   Pulse 91   Temp 98.1 F (36.7 C) (Temporal)   Resp 15   Ht 6\' 3"  (1.905 m)   Wt 206 lb 12.8 oz (93.8 kg)   SpO2 97%   BMI 25.85 kg/m   Physical Exam   General appearance: alert, cooperative and appears stated age Ears: normal TM's and external ear canals both ears Throat: lips, mucosa, and tongue normal; teeth and gums normal Neck: no adenopathy, no carotid bruit, supple, symmetrical, trachea midline and thyroid not enlarged, symmetric, no tenderness/mass/nodules Back: symmetric, no curvature. ROM normal. No CVA tenderness. Lungs: clear to auscultation bilaterally Heart: regular rate and rhythm, S1, S2 normal, no murmur, click, rub or gallop Abdomen: soft, non-tender; bowel sounds normal; no masses,  no organomegaly Pulses:  2+ and symmetric Skin: Skin color, texture, turgor normal. Diaphoretic palms,  No rashes or lesions Lymph nodes: Cervical, supraclavicular, and axillary nodes normal.    Assessment & Plan:   Problem List Items Addressed This Visit      Unprioritized   Fatty infiltration of liver    Noted on prior imaging studies.  Prior history of alcohol abuse prior to 2014.  Has been alcohol abstinent and has completed  the Hepatitis  A and B vaccines Series. Given his increase in liver enzymes I am recommending GI evaluation   Lab Results  Component Value Date   ALT 217 (H) 11/17/2019   AST 90 (H) 11/17/2019   ALKPHOS 67 11/17/2019   BILITOT 0.6 11/17/2019         White coat syndrome with  high blood pressure without hypertension    Home readings for the past 2 weeks are consistently < 130/80,  With 75% under 120/70.  No treatment indicated at this time       Encounter for preventive health examination    age appropriate education and counseling updated, referrals for preventative services and immunizations addressed, dietary and smoking counseling addressed, most recent labs reviewed.  I have personally reviewed and have noted:  1) the patient's medical and social history 2) The pt's use of alcohol, tobacco, and illicit drugs 3) The patient's current medications and supplements 4) Functional ability including ADL's, fall risk, home safety risk, hearing and visual impairment 5) Diet and physical activities 6) Evidence for depression or mood disorder 7) The patient's height, weight, and BMI have been recorded in the chart  I have made referrals, and provided counseling and education based on review of the above       Other Visit Diagnoses    Fatigue, unspecified type    -  Primary   Relevant Orders   Comprehensive metabolic panel (Completed)   TSH (Completed)   CBC with Differential/Platelet (Completed)   Hyperlipidemia LDL goal <160       Relevant Orders   Lipid panel (Completed)      I have discontinued Praneeth W. Mccosh's cyclobenzaprine and azithromycin. I am also having him maintain his Multi-Vitamins, Fish Oil, TURMERIC PO, cetirizine, ibuprofen, Probiotic Product (PROBIOTIC DAILY PO), famotidine, aspirin, acetaminophen, and Glucosamine-Chondroitin (GLUCOSAMINE CHONDR COMPLEX PO).  No orders of the defined types were placed in this encounter.   Medications Discontinued During This Encounter  Medication Reason  . azithromycin (ZITHROMAX Z-PAK) 250 MG tablet Completed Course  . cyclobenzaprine (FLEXERIL) 10 MG tablet Patient has not taken in last 30 days    Follow-up: No follow-ups on file.   [name], MD

## 2019-11-17 NOTE — Patient Instructions (Signed)

## 2019-11-18 LAB — LIPID PANEL
Cholesterol: 244 mg/dL — ABNORMAL HIGH (ref 0–200)
HDL: 48.7 mg/dL (ref >39.00)
LDL Cholesterol: 171 mg/dL — ABNORMAL HIGH (ref 0–99)
NonHDL: 195.7
Total CHOL/HDL Ratio: 5
Triglycerides: 124 mg/dL (ref 0.0–149.0)
VLDL: 24.8 mg/dL (ref 0.0–40.0)

## 2019-11-18 LAB — CBC WITH DIFFERENTIAL/PLATELET
Basophils Absolute: 0.1 10*3/uL (ref 0.0–0.1)
Basophils Relative: 0.6 % (ref 0.0–3.0)
Eosinophils Absolute: 0.2 10*3/uL (ref 0.0–0.7)
Eosinophils Relative: 2.1 % (ref 0.0–5.0)
HCT: 43.7 % (ref 39.0–52.0)
Hemoglobin: 15 g/dL (ref 13.0–17.0)
Lymphocytes Relative: 42 % (ref 12.0–46.0)
Lymphs Abs: 4.1 10*3/uL — ABNORMAL HIGH (ref 0.7–4.0)
MCHC: 34.3 g/dL (ref 30.0–36.0)
MCV: 91.7 fl (ref 78.0–100.0)
Monocytes Absolute: 0.8 10*3/uL (ref 0.1–1.0)
Monocytes Relative: 8.4 % (ref 3.0–12.0)
Neutro Abs: 4.6 10*3/uL (ref 1.4–7.7)
Neutrophils Relative %: 46.9 % (ref 43.0–77.0)
Platelets: 336 10*3/uL (ref 150.0–400.0)
RBC: 4.76 Mil/uL (ref 4.22–5.81)
RDW: 12.9 % (ref 11.5–15.5)
WBC: 9.9 10*3/uL (ref 4.0–10.5)

## 2019-11-18 LAB — COMPREHENSIVE METABOLIC PANEL
ALT: 217 U/L — ABNORMAL HIGH (ref 0–53)
AST: 90 U/L — ABNORMAL HIGH (ref 0–37)
Albumin: 5.5 g/dL — ABNORMAL HIGH (ref 3.5–5.2)
Alkaline Phosphatase: 67 U/L (ref 39–117)
BUN: 14 mg/dL (ref 6–23)
CO2: 28 mEq/L (ref 19–32)
Calcium: 10.4 mg/dL (ref 8.4–10.5)
Chloride: 102 mEq/L (ref 96–112)
Creatinine, Ser: 0.94 mg/dL (ref 0.40–1.50)
GFR: 85.93 mL/min (ref >60.00)
Glucose, Bld: 90 mg/dL (ref 70–99)
Potassium: 4.6 mEq/L (ref 3.5–5.1)
Sodium: 137 mEq/L (ref 135–145)
Total Bilirubin: 0.6 mg/dL (ref 0.2–1.2)
Total Protein: 8.6 g/dL — ABNORMAL HIGH (ref 6.0–8.3)

## 2019-11-18 LAB — MICROALBUMIN / CREATININE URINE RATIO
Creatinine,U: 17.9 mg/dL
Microalb Creat Ratio: 3.9 mg/g (ref 0.0–30.0)
Microalb, Ur: <0.7 mg/dL (ref 0.0–1.9)

## 2019-11-18 LAB — TSH: TSH: 1.32 u[IU]/mL (ref 0.35–4.50)

## 2019-11-19 ENCOUNTER — Encounter: Payer: Self-pay | Admitting: Internal Medicine

## 2019-11-19 DIAGNOSIS — R748 Abnormal levels of other serum enzymes: Secondary | ICD-10-CM

## 2019-11-19 DIAGNOSIS — K760 Fatty (change of) liver, not elsewhere classified: Secondary | ICD-10-CM

## 2019-11-19 NOTE — Assessment & Plan Note (Signed)
Noted on prior imaging studies.  Prior history of alcohol abuse prior to 2014.  Has been alcohol abstinent and has completed  the Hepatitis  A and B vaccines Series. Given his increase in liver enzymes I am recommending GI evaluation   Lab Results  Component Value Date   ALT 217 (H) 11/17/2019   AST 90 (H) 11/17/2019   ALKPHOS 67 11/17/2019   BILITOT 0.6 11/17/2019

## 2019-11-19 NOTE — Assessment & Plan Note (Addendum)
Home readings for the past 2 weeks are consistently < 130/80,  With 75% under 120/70.  No treatment indicated at this time

## 2019-11-19 NOTE — Assessment & Plan Note (Signed)

## 2019-12-24 ENCOUNTER — Ambulatory Visit: Payer: BC Managed Care – PPO | Admitting: Gastroenterology

## 2019-12-24 ENCOUNTER — Other Ambulatory Visit: Payer: Self-pay

## 2019-12-24 ENCOUNTER — Encounter: Payer: Self-pay | Admitting: Gastroenterology

## 2019-12-24 VITALS — BP 115/73 | HR 70 | Temp 98.0°F | Ht 75.0 in | Wt 203.6 lb

## 2019-12-24 DIAGNOSIS — R748 Abnormal levels of other serum enzymes: Secondary | ICD-10-CM

## 2019-12-24 NOTE — Progress Notes (Signed)
Primary Care Physician: [name], MD  Primary Gastroenterologist:  Dr. Lucilla Lame  Chief Complaint  Patient presents with  . fatty liver    HPI: Gary Mccullough is a 49 y.o. male here who was seen in the past for irritable bowel syndrome.  The patient has a history of alcohol use back before 2014 and was recently found to have abnormal liver enzymes.  The patient's liver enzymes recently have shown:  Component     Latest Ref Rng & Units 11/13/2017 11/14/2018 11/17/2019  Total Bilirubin     0.2 - 1.2 mg/dL 0.6 0.5 0.6  Alkaline Phosphatase     39 - 117 U/L 59 70 67  AST     0 - 37 U/L 22 26 90 (H)  ALT     0 - 53 U/L 34 54 (H) 217 (H)   He has been reported to have received vaccinations for hepatitis A and hepatitis B in the past. The patient reports that he has been taking an herbal tea that he states has been life-changing for his irritable bowel syndrome.  He also reports that he has spondylolisthesis and he takes anti-inflammatory medications on a daily basis.  He also takes 1000 mg of Tylenol at the same time.  The patient has a history of having a 2.4 cm hemangioma in the posterior left lobe of the liver.  The patient denies any alcohol abuse at the present time and he also denies any jaundice or abdominal pain.  Past Medical History:  Diagnosis Date  . Alcohol abuse    Last Heavy Use 2014  . Allergy   . Cervical stenosis of spine   . Chicken pox   . Fatty liver   . Liver hemangioma   . Seizures ([place])   . Spondylisthesis     Current Outpatient Medications  Medication Sig Dispense Refill  . acetaminophen (TYLENOL) 325 MG tablet Take 325 mg by mouth every 6 (six) hours as needed.    [name] aspirin 325 MG tablet Take 325 mg by mouth every 6 (six) hours as needed.     . cetirizine (ZYRTEC) 10 MG tablet Take 10 mg by mouth daily as needed.     . famotidine (PEPCID) 10 MG tablet Take 10 mg by mouth 2 (two) times daily.    . fluticasone (FLONASE) 50 MCG/ACT nasal  spray Place into both nostrils daily. AS NEEDED    . Glucosamine-Chondroitin (GLUCOSAMINE CHONDR COMPLEX PO) Take 1 capsule by mouth daily.    [name] ibuprofen (ADVIL,MOTRIN) 100 MG/5ML suspension Take 200 mg by mouth every 4 (four) hours as needed.    . Multiple Vitamin (MULTI-VITAMINS) TABS Take by mouth.    . Omega-3 Fatty Acids (FISH OIL) 1000 MG CAPS Take by mouth.    . Probiotic Product (PROBIOTIC DAILY PO) Take 1 capsule by mouth 3 (three) times daily.    . TURMERIC PO Take 2 capsules by mouth daily.     No current facility-administered medications for this visit.    Allergies as of 12/24/2019  . (No Known Allergies)    ROS:  General: Negative for anorexia, weight loss, fever, chills, fatigue, weakness. ENT: Negative for hoarseness, difficulty swallowing , nasal congestion. CV: Negative for chest pain, angina, palpitations, dyspnea on exertion, peripheral edema.  Respiratory: Negative for dyspnea at rest, dyspnea on exertion, cough, sputum, wheezing.  GI: See history of present illness. GU:  Negative for dysuria, hematuria, urinary incontinence, urinary frequency, nocturnal urination.  Endo:  Negative for unusual weight change.    Physical Examination:   BP 115/73   Pulse 70   Temp 98 F (36.7 C) (Oral)   Ht 6\' 3"  (1.905 m)   Wt 203 lb 9.6 oz (92.4 kg)   BMI 25.45 kg/m   General: Well-nourished, well-developed in no acute distress.  Eyes: No icterus. Conjunctivae pink. Lungs: Clear to auscultation bilaterally. Non-labored. Heart: Regular rate and rhythm, no murmurs rubs or gallops.  Abdomen: Bowel sounds are normal, nontender, nondistended, no hepatosplenomegaly or masses, no abdominal bruits or hernia , no rebound or guarding.   Extremities: No lower extremity edema. No clubbing or deformities. Neuro: Alert and oriented x 3.  Grossly intact. Skin: Warm and dry, no jaundice.   Psych: Alert and cooperative, normal mood and affect.  Labs:    Imaging Studies: No results  found.  Assessment and Plan:   Gary Mccullough is a 49 y.o. y/o male who comes in today with abnormal liver enzymes and a report of having fatty liver although his 2 CT scans from 2015 and 2016 in addition to his MRI in 2015 did not mention any fatty liver but did mention a hemangioma.  The patient will have lab sent off for other possible causes of abnormal liver enzymes and will also have a ultrasound of the right upper quadrant.  The patient will be notified with the results of the labs and based on that we will decide what further investigation may be needed.  The patient has been explained the plan agrees with it.     Lucilla Lame, MD. Marval Regal    Note: This dictation was prepared with Dragon dictation along with smaller phrase technology. Any transcriptional errors that result from this process are unintentional.

## 2019-12-28 LAB — HEPATIC FUNCTION PANEL
ALT: 134 IU/L — ABNORMAL HIGH (ref 0–44)
AST: 63 IU/L — ABNORMAL HIGH (ref 0–40)
Albumin: 5 g/dL (ref 4.0–5.0)
Alkaline Phosphatase: 80 IU/L (ref 39–117)
Bilirubin Total: 0.4 mg/dL (ref 0.0–1.2)
Bilirubin, Direct: 0.12 mg/dL (ref 0.00–0.40)
Total Protein: 7.4 g/dL (ref 6.0–8.5)

## 2019-12-28 LAB — ALPHA-1-ANTITRYPSIN: A-1 Antitrypsin: 123 mg/dL (ref 101–187)

## 2019-12-28 LAB — HEPATITIS B SURFACE ANTIGEN: Hepatitis B Surface Ag: NEGATIVE

## 2019-12-28 LAB — HEPATITIS B SURFACE ANTIBODY,QUALITATIVE: Hep B Surface Ab, Qual: REACTIVE

## 2019-12-28 LAB — ANA: Anti Nuclear Antibody (ANA): POSITIVE — AB

## 2019-12-28 LAB — MITOCHONDRIAL ANTIBODIES: Mitochondrial Ab: <20 Units (ref 0.0–20.0)

## 2019-12-28 LAB — IRON AND TIBC
Iron Saturation: 24 % (ref 15–55)
Iron: 94 ug/dL (ref 38–169)
Total Iron Binding Capacity: 384 ug/dL (ref 250–450)
UIBC: 290 ug/dL (ref 111–343)

## 2019-12-28 LAB — FERRITIN: Ferritin: 362 ng/mL (ref 30–400)

## 2019-12-28 LAB — HEPATITIS A ANTIBODY, TOTAL: hep A Total Ab: POSITIVE — AB

## 2019-12-28 LAB — ANTI-SMOOTH MUSCLE ANTIBODY, IGG: Smooth Muscle Ab: 6 Units (ref 0–19)

## 2019-12-28 LAB — GAMMA GT: GGT: 36 IU/L (ref 0–65)

## 2019-12-28 LAB — CERULOPLASMIN: Ceruloplasmin: 19.3 mg/dL (ref 16.0–31.0)

## 2019-12-29 ENCOUNTER — Telehealth: Payer: Self-pay

## 2019-12-29 NOTE — Telephone Encounter (Signed)
Pt notified of lab results via mychart. 

## 2019-12-29 NOTE — Telephone Encounter (Signed)
-----   Message from Lucilla Lame, MD sent at 12/29/2019  8:16 AM EDT ----- Let the patient know that the blood test showed him to be immune to hepatitis A and hepatitis B therefore he does not need any vaccinations.  He also did not have any obvious cause for his abnormal liver enzymes seen on the blood work.  The labs are improving and I recommend repeating the liver function tests in 1 month.

## 2019-12-31 ENCOUNTER — Ambulatory Visit
Admission: RE | Admit: 2019-12-31 | Discharge: 2019-12-31 | Disposition: A | Discharge status: Home / Self Care | Payer: BC Managed Care – PPO | Source: Ambulatory Visit | Attending: Gastroenterology | Admitting: Gastroenterology

## 2019-12-31 ENCOUNTER — Other Ambulatory Visit: Payer: Self-pay

## 2019-12-31 DIAGNOSIS — R748 Abnormal levels of other serum enzymes: Secondary | ICD-10-CM | POA: Diagnosis not present

## 2019-12-31 DIAGNOSIS — K7689 Other specified diseases of liver: Secondary | ICD-10-CM | POA: Diagnosis not present

## 2020-01-07 ENCOUNTER — Telehealth: Payer: Self-pay

## 2020-01-07 NOTE — Telephone Encounter (Signed)
Pt notified of results via mychart.  

## 2020-01-07 NOTE — Telephone Encounter (Signed)
-----   Message from Lucilla Lame, MD sent at 01/07/2020  6:42 AM EDT ----- With the patient know that the ultrasound came back and it showed him to have a stable hemangioma that was seen in 2015 and the other finding of the ultrasound was fatty liver.  I still recommend repeating the liver enzymes in a few months to see if his liver enzymes have come down otherwise he may need liver biopsy.

## 2020-01-26 ENCOUNTER — Ambulatory Visit: Payer: BC Managed Care – PPO | Admitting: Gastroenterology

## 2020-11-21 ENCOUNTER — Other Ambulatory Visit: Payer: Self-pay

## 2020-11-21 ENCOUNTER — Ambulatory Visit (INDEPENDENT_AMBULATORY_CARE_PROVIDER_SITE_OTHER): Payer: BC Managed Care – PPO | Admitting: Internal Medicine

## 2020-11-21 ENCOUNTER — Encounter: Payer: Self-pay | Admitting: Internal Medicine

## 2020-11-21 VITALS — BP 118/80 | HR 80 | Temp 98.0°F | Resp 15 | Ht 75.0 in | Wt 210.2 lb

## 2020-11-21 DIAGNOSIS — Z1211 Encounter for screening for malignant neoplasm of colon: Secondary | ICD-10-CM | POA: Insufficient documentation

## 2020-11-21 DIAGNOSIS — K760 Fatty (change of) liver, not elsewhere classified: Secondary | ICD-10-CM

## 2020-11-21 DIAGNOSIS — Z0000 Encounter for general adult medical examination without abnormal findings: Secondary | ICD-10-CM

## 2020-11-21 DIAGNOSIS — R109 Unspecified abdominal pain: Secondary | ICD-10-CM

## 2020-11-21 DIAGNOSIS — R5383 Other fatigue: Secondary | ICD-10-CM | POA: Diagnosis not present

## 2020-11-21 NOTE — Assessment & Plan Note (Signed)
Diagnostic colonoscopy was done in 2015 by [facility] Dr Candace Cruise and was negative for polyps.  Repeat in 2025

## 2020-11-21 NOTE — Assessment & Plan Note (Signed)
Resolved with resolution of constipation and decreased stress level achieved after retirement

## 2020-11-21 NOTE — Patient Instructions (Signed)

## 2020-11-21 NOTE — Assessment & Plan Note (Signed)

## 2020-11-21 NOTE — Progress Notes (Signed)
Patient ID: Gary Mccullough, male    DOB: 1971-08-19  Age: 50 y.o. MRN: [id number]  The patient is here for annual preventive  examination and management of other chronic and acute problems.  This visit occurred during the SARS-CoV-2 public health emergency.  Safety protocols were in place, including screening questions prior to the visit, additional usage of staff PPE, and extensive cleaning of exam room while observing appropriate contact time as indicated for disinfecting solutions.      The risk factors are reflected in the social history.  The roster of all physicians providing medical care to patient - is listed in the Snapshot section of the chart.  Activities of daily living:  The patient is 100% independent in all ADLs: dressing, toileting, feeding as well as independent mobility  Home safety : The patient has smoke detectors in the home. They wear seatbelts.  There are no firearms at home. There is no violence in the home.   There is no risks for hepatitis, STDs or HIV. There is no   history of blood transfusion. They have no travel history to infectious disease endemic areas of the world.  The patient has seen their dentist in the last six month. They have seen their eye doctor in the last year. They admit to slight hearing difficulty with regard to whispered voices and some television programs.  They have deferred audiologic testing in the last year.  They do not  have excessive sun exposure. Discussed the need for sun protection: hats, long sleeves and use of sunscreen if there is significant sun exposure.   Diet: the importance of a healthy diet is discussed. They do have a healthy diet.  The benefits of regular aerobic exercise were discussed. He plays disk golf 2-3 times per week .     Depression screen: there are no signs or vegative symptoms of depression- irritability, change in appetite, anhedonia, sadness/tearfullness.  The following portions of the patient's history  were reviewed and updated as appropriate: allergies, current medications, past family history, past medical history,  past surgical history, past social history  and problem list.  Visual acuity was not assessed per patient preference since she has regular follow up with her ophthalmologist. Hearing and body mass index were assessed and reviewed.   During the course of the visit the patient was educated and counseled about appropriate screening and preventive services including : fall prevention , diabetes screening, nutrition counseling, colorectal cancer screening, and recommended immunizations.    CC: The primary encounter diagnosis was Fatty infiltration of liver. Diagnoses of Fatigue, unspecified type, Encounter for preventive health examination, Functional abdominal pain syndrome, and Colon cancer screening were also pertinent to this visit.   1) follow up on fatty liver:  Has limited his alcohol use to less than one daily.  Exercising and eating a health diet.   2) IBS is much better,   constipation occurring rarely  Now less than once a month. On healthy diet of fruits and vegetables.    3) neck and back pain chronic;  With prior MRI's noting spondylolithesisis .. he reports rare radiculitis ,after strenuous work  ,managed with stretching and yoga,  No meds except aspirin averaging 2/month   s) GERD    [name] has a past medical history of Alcohol abuse, Allergy, Cervical stenosis of spine, Chicken pox, Fatty liver, Liver hemangioma, Seizures ([place]), and Spondylisthesis.   He has a past surgical history that includes Hernia repair; Fracture surgery (1990); Shoulder surgery (  2013); and Finger surgery (1980's).   His family history includes Alcohol abuse in his father; CVA in his father; Gout in his brother.He reports that he quit smoking about 7 years ago. His smoking use included cigarettes. He started smoking about 27 years ago. He smoked 1.00 pack per day. He uses smokeless  tobacco. He reports previous alcohol use. He reports that he does not use drugs.  Outpatient Medications Prior to Visit  Medication Sig Dispense Refill  . aspirin 325 MG tablet Take 325 mg by mouth every 6 (six) hours as needed.     . cetirizine (ZYRTEC) 10 MG tablet Take 10 mg by mouth daily as needed.     . famotidine (PEPCID) 10 MG tablet Take 10 mg by mouth 2 (two) times daily.    . fluticasone (FLONASE) 50 MCG/ACT nasal spray Place into both nostrils daily. AS NEEDED    . Glucosamine-Chondroitin (GLUCOSAMINE CHONDR COMPLEX PO) Take 1 capsule by mouth daily.    . Multiple Vitamin (MULTI-VITAMINS) TABS Take by mouth.    . Omega-3 Fatty Acids (FISH OIL) 1000 MG CAPS Take by mouth.    . TURMERIC PO Take 2 capsules by mouth daily.    [name] acetaminophen (TYLENOL) 325 MG tablet Take 325 mg by mouth every 6 (six) hours as needed.    [name] ibuprofen (ADVIL,MOTRIN) 100 MG/5ML suspension Take 200 mg by mouth every 4 (four) hours as needed.    . Probiotic Product (PROBIOTIC DAILY PO) Take 1 capsule by mouth 3 (three) times daily.     No facility-administered medications prior to visit.    Review of Systems  Patient denies headache, fevers, malaise, unintentional weight loss, skin rash, eye pain, sinus congestion and sinus pain, sore throat, dysphagia,  hemoptysis , cough, dyspnea, wheezing, chest pain, palpitations, orthopnea, edema, abdominal pain, nausea, melena, diarrhea, constipation, flank pain, dysuria, hematuria, urinary  Frequency, nocturia, numbness, tingling, seizures,  Focal weakness, Loss of consciousness,  Tremor, insomnia, depression, anxiety, and suicidal ideation.     Objective:  BP 118/80 (BP Location: Left Arm, Patient Position: Sitting, Cuff Size: Normal)   Pulse 80   Temp 98 F (36.7 C) (Oral)   Resp 15   Ht 6\' 3"  (1.905 m)   Wt 210 lb 3.2 oz (95.3 kg)   SpO2 98%   BMI 26.27 kg/m   Physical Exam  General appearance: alert, cooperative and appears stated age Ears: normal  TM's and external ear canals both ears Throat: lips, mucosa, and tongue normal; teeth and gums normal Neck: no adenopathy, no carotid bruit, supple, symmetrical, trachea midline and thyroid not enlarged, symmetric, no tenderness/mass/nodules Back: symmetric, no curvature. ROM normal. No CVA tenderness. Lungs: clear to auscultation bilaterally Heart: regular rate and rhythm, S1, S2 normal, no murmur, click, rub or gallop Abdomen: soft, non-tender; bowel sounds normal; no masses,  no organomegaly Pulses: 2+ and symmetric Skin: Skin color, texture, turgor normal. No rashes or lesions Lymph nodes: Cervical, supraclavicular, and axillary nodes normal.  Assessment & Plan:   Problem List Items Addressed This Visit      Unprioritized   Functional abdominal pain syndrome    Resolved with resolution of constipation and decreased stress level achieved after retirement       Fatty infiltration of liver - Primary   Relevant Orders   Comprehensive metabolic panel   Lipid panel   Encounter for preventive health examination    age appropriate education and counseling updated, referrals for preventative services and immunizations addressed, dietary and  smoking counseling addressed, most recent labs reviewed.  I have personally reviewed and have noted:  1) the patient's medical and social history 2) The pt's use of alcohol, tobacco, and illicit drugs 3) The patient's current medications and supplements 4) Functional ability including ADL's, fall risk, home safety risk, hearing and visual impairment 5) Diet and physical activities 6) Evidence for depression or mood disorder 7) The patient's height, weight, and BMI have been recorded in the chart  I have made referrals, and provided counseling and education based on review of the above      Colon cancer screening    Diagnostic colonoscopy was done in 2015 by [facility] Dr Candace Cruise and was negative for polyps.  Repeat in 2025       Other Visit  Diagnoses    Fatigue, unspecified type       Relevant Orders   Vitamin B12   CBC with Differential/Platelet      I have discontinued Edsel W. Beaver's ibuprofen, Probiotic Product (PROBIOTIC DAILY PO), and acetaminophen. I am also having him maintain his Multi-Vitamins, Fish Oil, TURMERIC PO, cetirizine, famotidine, aspirin, Glucosamine-Chondroitin (GLUCOSAMINE CHONDR COMPLEX PO), and fluticasone.  No orders of the defined types were placed in this encounter.   Medications Discontinued During This Encounter  Medication Reason  . acetaminophen (TYLENOL) 325 MG tablet   . ibuprofen (ADVIL,MOTRIN) 100 MG/5ML suspension   . Probiotic Product (PROBIOTIC DAILY PO)     Follow-up: No follow-ups on file.   [name], MD

## 2020-11-22 LAB — CBC WITH DIFFERENTIAL/PLATELET
Basophils Absolute: 0 10*3/uL (ref 0.0–0.1)
Basophils Relative: 0.6 % (ref 0.0–3.0)
Eosinophils Absolute: 0.3 10*3/uL (ref 0.0–0.7)
Eosinophils Relative: 3.2 % (ref 0.0–5.0)
HCT: 41.7 % (ref 39.0–52.0)
Hemoglobin: 14.2 g/dL (ref 13.0–17.0)
Lymphocytes Relative: 44.9 % (ref 12.0–46.0)
Lymphs Abs: 3.6 10*3/uL (ref 0.7–4.0)
MCHC: 34.2 g/dL (ref 30.0–36.0)
MCV: 92.7 fl (ref 78.0–100.0)
Monocytes Absolute: 0.6 10*3/uL (ref 0.1–1.0)
Monocytes Relative: 7.6 % (ref 3.0–12.0)
Neutro Abs: 3.5 10*3/uL (ref 1.4–7.7)
Neutrophils Relative %: 43.7 % (ref 43.0–77.0)
Platelets: 286 10*3/uL (ref 150.0–400.0)
RBC: 4.5 Mil/uL (ref 4.22–5.81)
RDW: 12.9 % (ref 11.5–15.5)
WBC: 8 10*3/uL (ref 4.0–10.5)

## 2020-11-22 LAB — LIPID PANEL
Cholesterol: 199 mg/dL (ref 0–200)
HDL: 50.5 mg/dL (ref >39.00)
LDL Cholesterol: 110 mg/dL — ABNORMAL HIGH (ref 0–99)
NonHDL: 148.16
Total CHOL/HDL Ratio: 4
Triglycerides: 191 mg/dL — ABNORMAL HIGH (ref 0.0–149.0)
VLDL: 38.2 mg/dL (ref 0.0–40.0)

## 2020-11-22 LAB — COMPREHENSIVE METABOLIC PANEL
ALT: 61 U/L — ABNORMAL HIGH (ref 0–53)
AST: 30 U/L (ref 0–37)
Albumin: 4.8 g/dL (ref 3.5–5.2)
Alkaline Phosphatase: 60 U/L (ref 39–117)
BUN: 13 mg/dL (ref 6–23)
CO2: 28 mEq/L (ref 19–32)
Calcium: 10.1 mg/dL (ref 8.4–10.5)
Chloride: 101 mEq/L (ref 96–112)
Creatinine, Ser: 0.88 mg/dL (ref 0.40–1.50)
GFR: 101.85 mL/min (ref >60.00)
Glucose, Bld: 82 mg/dL (ref 70–99)
Potassium: 5.1 mEq/L (ref 3.5–5.1)
Sodium: 138 mEq/L (ref 135–145)
Total Bilirubin: 0.6 mg/dL (ref 0.2–1.2)
Total Protein: 7.6 g/dL (ref 6.0–8.3)

## 2020-11-22 LAB — VITAMIN B12: Vitamin B-12: 485 pg/mL (ref 211–911)

## 2020-12-05 DIAGNOSIS — K760 Fatty (change of) liver, not elsewhere classified: Secondary | ICD-10-CM

## 2021-01-03 DIAGNOSIS — Z6825 Body mass index (BMI) 25.0-25.9, adult: Secondary | ICD-10-CM | POA: Diagnosis not present

## 2021-01-03 DIAGNOSIS — R748 Abnormal levels of other serum enzymes: Secondary | ICD-10-CM | POA: Diagnosis not present

## 2021-01-03 DIAGNOSIS — K760 Fatty (change of) liver, not elsewhere classified: Secondary | ICD-10-CM | POA: Diagnosis not present

## 2021-01-27 ENCOUNTER — Telehealth: Payer: Self-pay | Admitting: Internal Medicine

## 2021-01-27 NOTE — Telephone Encounter (Signed)
lft pt vm to call ofc to follow up on referral to [facility] hepatology. Thanks

## 2021-02-13 DIAGNOSIS — R932 Abnormal findings on diagnostic imaging of liver and biliary tract: Secondary | ICD-10-CM | POA: Diagnosis not present

## 2021-02-13 DIAGNOSIS — K760 Fatty (change of) liver, not elsewhere classified: Secondary | ICD-10-CM | POA: Diagnosis not present

## 2021-02-13 DIAGNOSIS — N281 Cyst of kidney, acquired: Secondary | ICD-10-CM | POA: Diagnosis not present

## 2021-02-13 DIAGNOSIS — R748 Abnormal levels of other serum enzymes: Secondary | ICD-10-CM | POA: Diagnosis not present

## 2021-03-25 IMAGING — US US ABDOMEN LIMITED
1 series · 14 of 25 positions shown · non-contrast
Comparison: CT 09/29/2015.  MRI 07/16/2014.

CLINICAL DATA: Elevated liver enzymes.

EXAM:
ULTRASOUND ABDOMEN LIMITED RIGHT UPPER QUADRANT

[Series 1: us abdomen limited ruq · 14 of 47 slices shown]
[im 1/47]
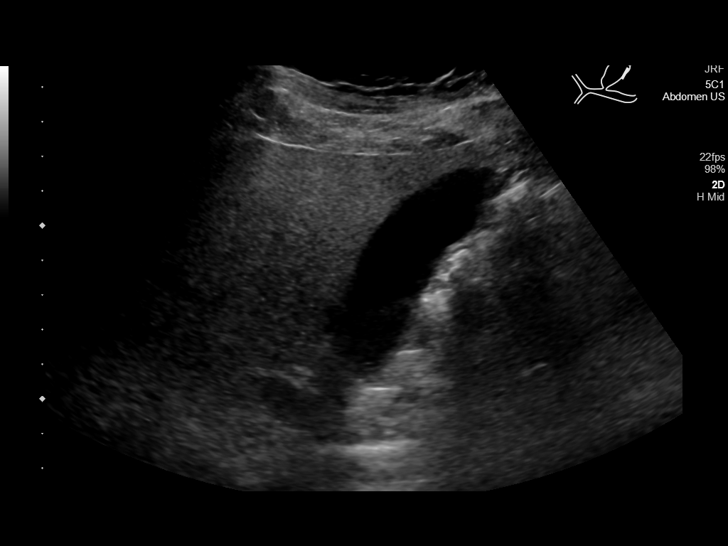
[im 4/47]
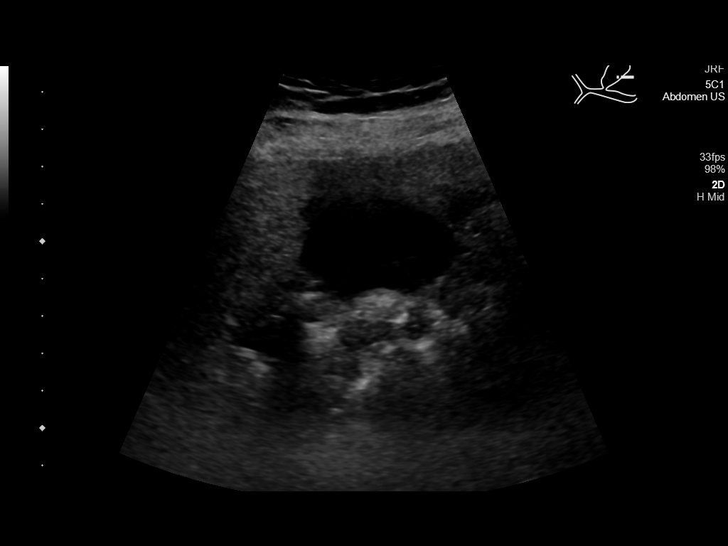
[im 8/47]
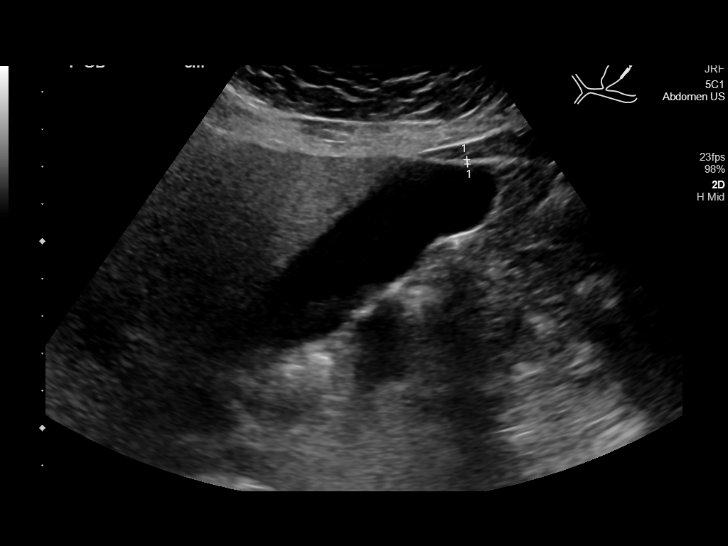
[im 12/47]
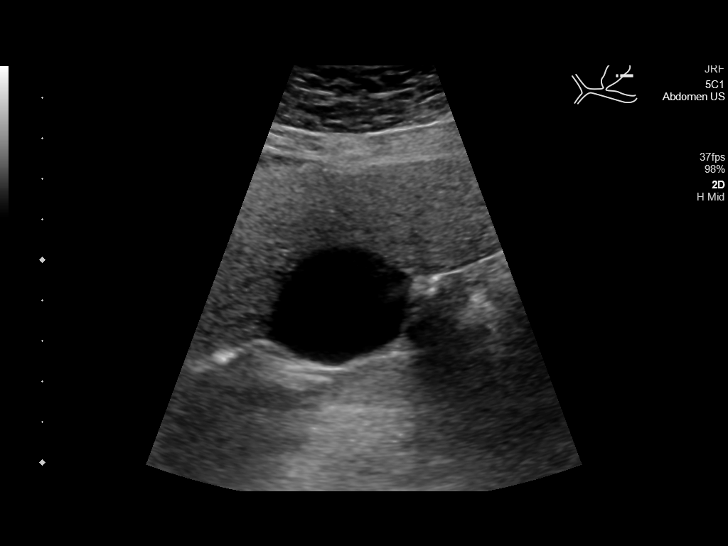
[im 16/47]
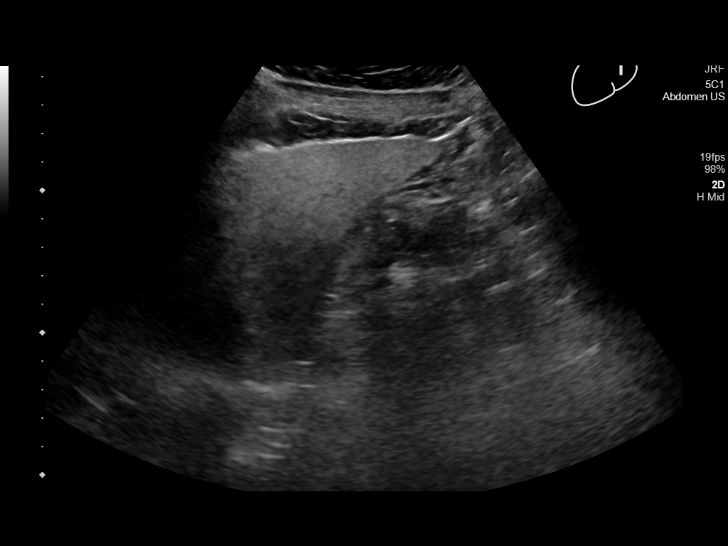
[im 18/47]
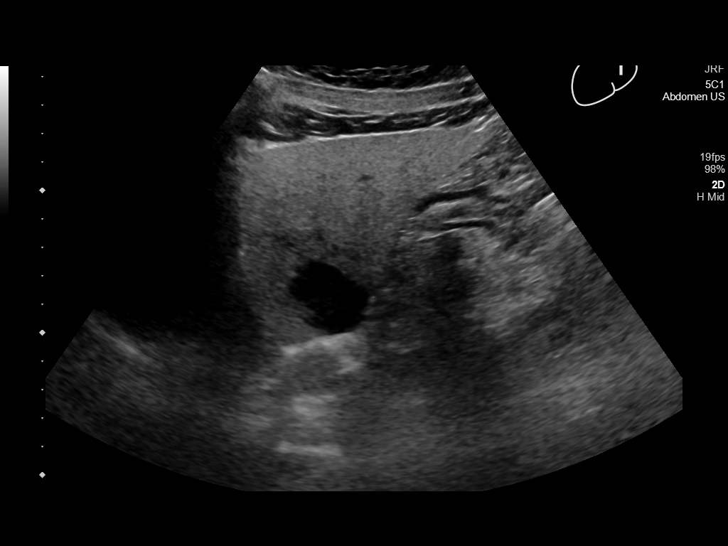
[im 22/47]
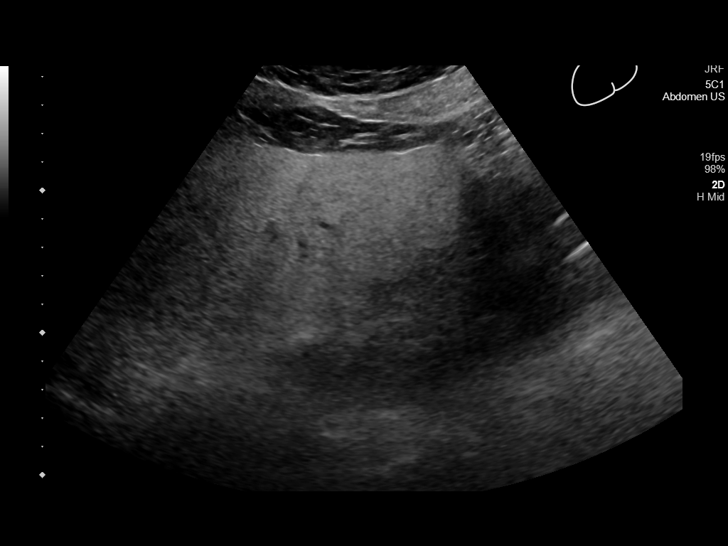
[im 25/47]
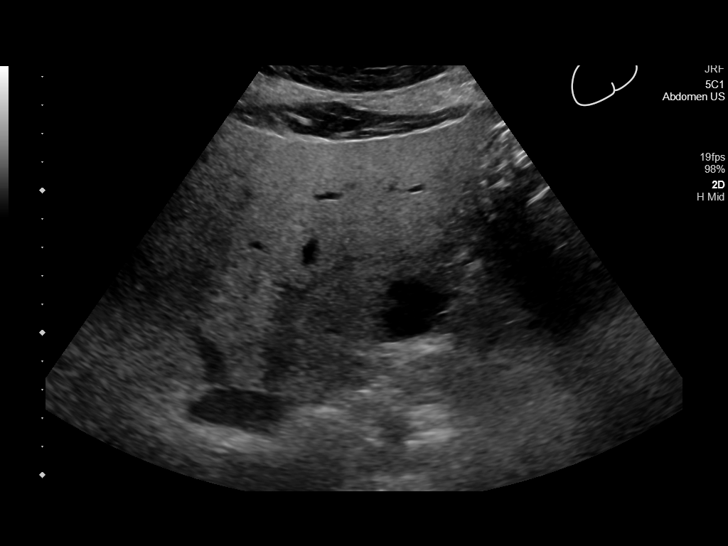
[im 29/47]
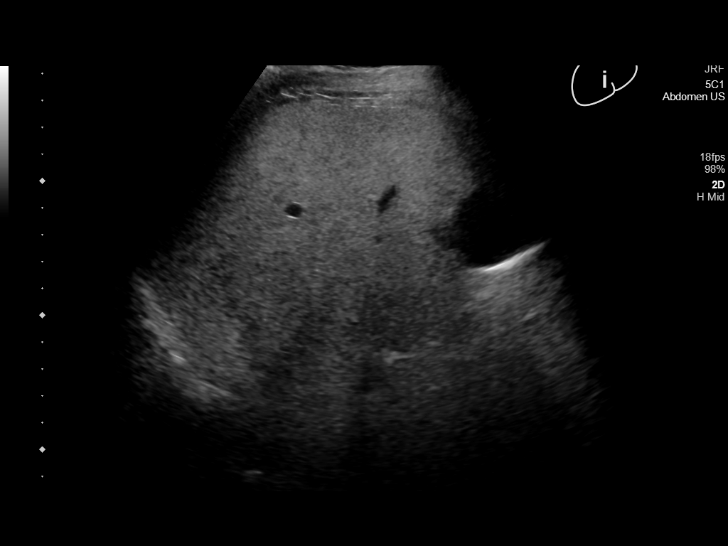
[im 31/47]
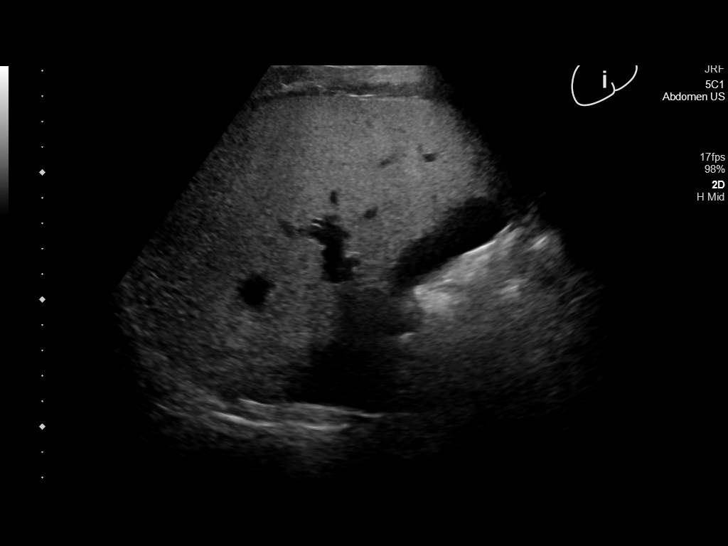
[im 35/47]
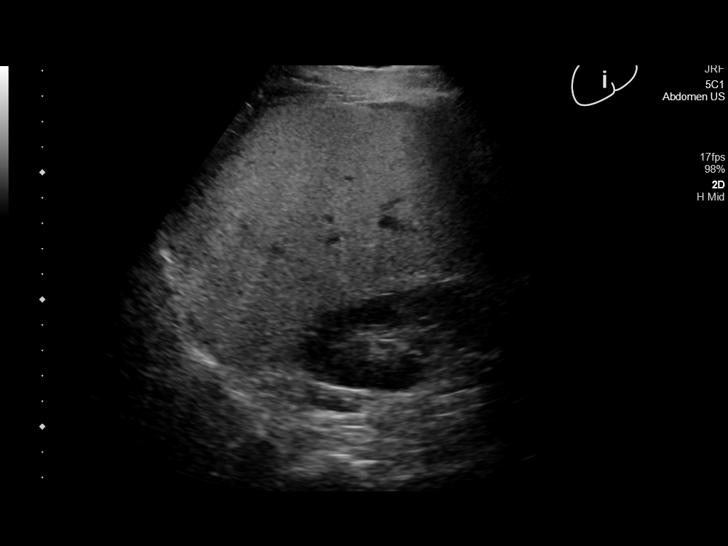
[im 39/47]
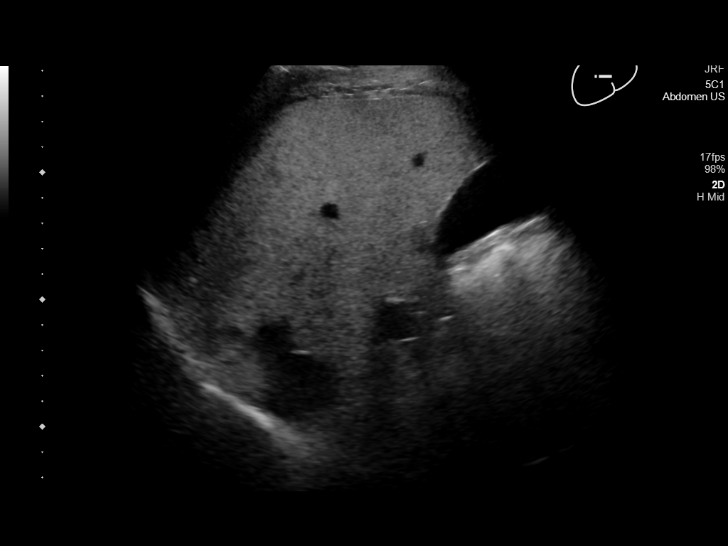
[im 43/47]
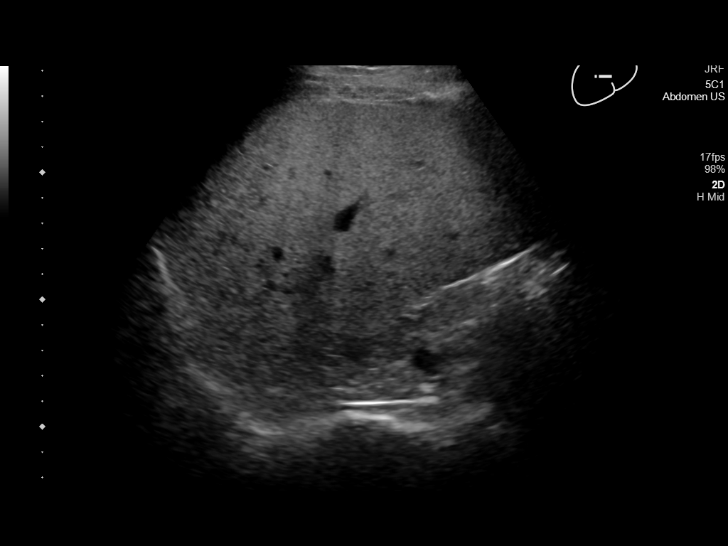
[im 47/47]
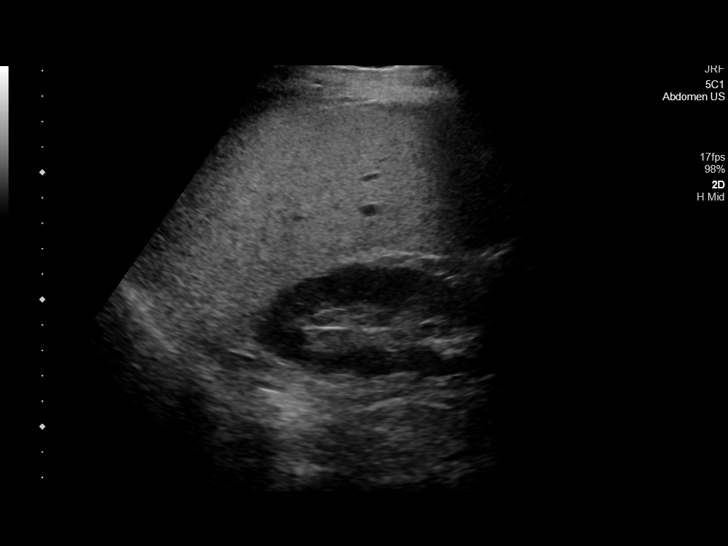

[14 of 25 positions shown; findings below may reference images not displayed]

FINDINGS: Gallbladder:

No gallstones or wall thickening visualized. No sonographic Murphy
sign noted by sonographer.

Common bile duct:

Diameter: 2.3 mm

Liver:

Increased echogenicity consistent fatty infiltration or
hepatocellular disease. Stable hypoechoic approximately 3 cm mass
noted the left hepatic lobe consistent with previously identified
benign hemangioma. Portal vein is patent on color Doppler imaging
with normal direction of blood flow towards the liver.

Other: None.
IMPRESSION: 1. Increased hepatic echogenicity consistent with fatty infiltration
or hepatocellular disease.

2. Stable lesion noted the posterior aspect of the left lobe of the
liver consistent with benign hemangioma as noted on prior MRI of
07/16/2014.

## 2021-03-27 DIAGNOSIS — K769 Liver disease, unspecified: Secondary | ICD-10-CM | POA: Diagnosis not present

## 2021-03-27 DIAGNOSIS — K760 Fatty (change of) liver, not elsewhere classified: Secondary | ICD-10-CM | POA: Diagnosis not present

## 2021-04-06 DIAGNOSIS — K760 Fatty (change of) liver, not elsewhere classified: Secondary | ICD-10-CM | POA: Diagnosis not present

## 2021-04-06 DIAGNOSIS — R748 Abnormal levels of other serum enzymes: Secondary | ICD-10-CM | POA: Diagnosis not present

## 2021-07-24 ENCOUNTER — Emergency Department: Payer: BC Managed Care – PPO

## 2021-07-24 ENCOUNTER — Other Ambulatory Visit: Payer: Self-pay

## 2021-07-24 ENCOUNTER — Emergency Department
Admission: EM | Admit: 2021-07-24 | Discharge: 2021-07-24 | Disposition: A | Discharge status: Home / Self Care | Payer: BC Managed Care – PPO | Attending: Emergency Medicine | Admitting: Emergency Medicine

## 2021-07-24 DIAGNOSIS — M546 Pain in thoracic spine: Secondary | ICD-10-CM | POA: Diagnosis not present

## 2021-07-24 DIAGNOSIS — M549 Dorsalgia, unspecified: Secondary | ICD-10-CM

## 2021-07-24 DIAGNOSIS — M25519 Pain in unspecified shoulder: Secondary | ICD-10-CM | POA: Diagnosis not present

## 2021-07-24 DIAGNOSIS — M439 Deforming dorsopathy, unspecified: Secondary | ICD-10-CM | POA: Diagnosis not present

## 2021-07-24 DIAGNOSIS — Z87891 Personal history of nicotine dependence: Secondary | ICD-10-CM | POA: Diagnosis not present

## 2021-07-24 DIAGNOSIS — S299XXA Unspecified injury of thorax, initial encounter: Secondary | ICD-10-CM | POA: Diagnosis not present

## 2021-07-24 MED ORDER — CYCLOBENZAPRINE HCL 10 MG PO TABS: 10.0000 mg | ORAL_TABLET | Freq: Three times a day (TID) | ORAL | 0 refills | Status: DC | PRN

## 2021-07-24 MED ORDER — METHYLPREDNISOLONE 4 MG PO TBPK: ORAL_TABLET | ORAL | 0 refills | Status: DC

## 2021-07-24 MED ORDER — KETOROLAC TROMETHAMINE 30 MG/ML IJ SOLN
30.0000 mg | Freq: Once | INTRAMUSCULAR | Status: AC
  Administered 2021-07-24: 30 mg via INTRAMUSCULAR
  Filled 2021-07-24: qty 1

## 2021-07-24 MED ORDER — HYDROCODONE-ACETAMINOPHEN 5-325 MG PO TABS: 1.0000 | ORAL_TABLET | Freq: Four times a day (QID) | ORAL | 0 refills | Status: DC | PRN

## 2021-07-24 MED ORDER — CYCLOBENZAPRINE HCL 10 MG PO TABS
10.0000 mg | ORAL_TABLET | Freq: Once | ORAL | Status: AC
  Administered 2021-07-24: 10 mg via ORAL
  Filled 2021-07-24: qty 1

## 2021-07-24 NOTE — ED Triage Notes (Signed)
Pt states that he injured himself playing golf 7 days ago, states that he didn't feel a pop when it happened but that it feels like a pulled muscle but much worse than what he has ever had, states that it is more painful to stand or sit up straight but state he has relief when he lays down

## 2021-07-24 NOTE — ED Notes (Signed)
See triage note  presents with mid back pain  states he felt a pop to back while playing golf about 1 week ago  ambulates well

## 2021-07-24 NOTE — ED Provider Notes (Signed)
[facility] Emergency Department Provider Note  ____________________________________________   Event Date/Time   First MD Initiated Contact with Patient 07/24/21 1230     (approximate)  I have reviewed the triage vital signs and the nursing notes.   HISTORY  Chief Complaint Back Pain    HPI Gary Mccullough is a 50 y.o. male presents emergency department with concerns of upper back pain.  Patient felt a pop while playing disc golf 1 week ago.  States he played in a tournament and they played 4 rounds.  Is having some shoulder pain but mostly the pain is in the center of his back.  No numbness or tingling.  No chest pain/shortness of breath  Past Medical History:  Diagnosis Date   Alcohol abuse    Last Heavy Use 2014   Allergy    Cervical stenosis of spine    Chicken pox    Fatty liver    Liver hemangioma    Seizures ([place])    Spondylisthesis     Patient Active Problem List   Diagnosis Date Noted   Colon cancer screening 11/21/2020   Encounter for preventive health examination 11/15/2018   White coat syndrome with high blood pressure without hypertension 11/15/2016   GAD (generalized anxiety disorder) 08/12/2016   Bleeding hemorrhoid 08/12/2016   Spondylolysis of lumbar region 08/12/2016   Benign liver cyst 08/12/2016   Renal cyst, acquired, left 08/12/2016   Elevated total protein 08/12/2016   Hemangioma of liver 08/10/2016   History of drug withdrawal syndrome 05/12/2016   Irritable bowel syndrome (IBS) 01/27/2016   Functional abdominal pain syndrome 08/06/2015   History of tobacco abuse 08/06/2015   Allergic rhinitis 06/10/2014   Cervical spinal stenosis 06/10/2014   Acid reflux 06/10/2014   Fatty infiltration of liver 06/10/2014    Past Surgical History:  Procedure Laterality Date   FINGER SURGERY  1980's   [place]  2013   Dr, [place]     Prior to Admission medications   Medication Sig Start Date End Date Taking? Authorizing Provider  cyclobenzaprine (FLEXERIL) 10 MG tablet Take 1 tablet (10 mg total) by mouth 3 (three) times daily as needed. 07/24/21  Yes Janee Ureste, Linden Dolin, PA-C  HYDROcodone-acetaminophen (NORCO/VICODIN) 5-325 MG tablet Take 1 tablet by mouth every 6 (six) hours as needed for moderate pain. 07/24/21  Yes Clarence Cogswell, Linden Dolin, PA-C  methylPREDNISolone (MEDROL DOSEPAK) 4 MG TBPK tablet Take 6 pills on day one then decrease by 1 pill each day 07/24/21  Yes Jamie Hafford, Linden Dolin, PA-C  aspirin 325 MG tablet Take 325 mg by mouth every 6 (six) hours as needed.     [provider]  cetirizine (ZYRTEC) 10 MG tablet Take 10 mg by mouth daily as needed.     [provider]  famotidine (PEPCID) 10 MG tablet Take 10 mg by mouth 2 (two) times daily.    [provider]  fluticasone (FLONASE) 50 MCG/ACT nasal spray Place into both nostrils daily. AS NEEDED    [provider]  Glucosamine-Chondroitin (GLUCOSAMINE CHONDR COMPLEX PO) Take 1 capsule by mouth daily.    [provider]  Multiple Vitamin (MULTI-VITAMINS) TABS Take by mouth.    [provider]  Omega-3 Fatty Acids (FISH OIL) 1000 MG CAPS Take by mouth.    [provider]  TURMERIC PO Take 2 capsules by  mouth daily.    [provider]    Allergies Patient has no known allergies.  Family History  Problem Relation Age of Onset   CVA Father    Alcohol abuse Father    Gout Brother     Social History Social History   Tobacco Use   Smoking status: Former    Packs/day: 1.00    Types: Cigarettes    Start date: 08/02/1993    Quit date: 06/02/2013    Years since quitting: 8.1   Smokeless tobacco: Current   Tobacco comments:    Vapes for the past 2 years   Vaping Use   Vaping Use: Every day  Substance Use Topics   Alcohol use: Not Currently    Alcohol/week: 0.0 standard drinks    Comment:  Occasional   Drug use: No    Review of Systems  Constitutional: No fever/chills Eyes: No visual changes. ENT: No sore throat. Respiratory: Denies cough Cardiovascular: Denies chest pain Gastrointestinal: Denies abdominal pain Genitourinary: Negative for dysuria. Musculoskeletal: Positive for back pain. Skin: Negative for rash. Psychiatric: no mood changes,     ____________________________________________   PHYSICAL EXAM:  VITAL SIGNS: ED Triage Vitals [07/24/21 0943]  Enc Vitals Group     BP 123/89     Pulse Rate 90     Resp 16     Temp 98 F (36.7 C)     Temp Source Oral     SpO2 100 %     Weight 195 lb (88.5 kg)     Height 6\' 3"  (1.905 m)     Head Circumference      Peak Flow      Pain Score 8     Pain Loc      Pain Edu?      Excl. in [place]?     Constitutional: Alert and oriented. Well appearing and in no acute distress. Eyes: Conjunctivae are normal.  Head: Atraumatic. Nose: No congestion/rhinnorhea. Mouth/Throat: Mucous membranes are moist.   Neck:  supple no lymphadenopathy noted Cardiovascular: Normal rate, regular rhythm. Heart sounds are normal Respiratory: Normal respiratory effort.  No retractions, lungs c t a  GU: deferred Musculoskeletal: FROM all extremities, warm and well perfused T-SPINE is tender to palpation, left shoulder is nontender, muscles are spasmed Neurologic:  Normal speech and language.  Skin:  Skin is warm, dry and intact. No rash noted. Psychiatric: Mood and affect are normal. Speech and behavior are normal.  ____________________________________________   LABS (all labs ordered are listed, but only abnormal results are displayed)  Labs Reviewed - No data to display ____________________________________________   ____________________________________________  RADIOLOGY  X-ray of the T-spine  ____________________________________________   PROCEDURES  Procedure(s) performed:  No  Procedures    ____________________________________________   INITIAL IMPRESSION / ASSESSMENT AND PLAN / ED COURSE  Pertinent labs & imaging results that were available during my care of the patient were reviewed by me and considered in my medical decision making (see chart for details).   The patient is a 50 year old male presents emergency department with upper back pain.  See HPI.  Physical exam shows patient appears stable  X-ray of the T-spine  X-ray of the T-spine reviewed by me confirming radiology be negative for any acute abnormality  Did explain the findings to the patient.  He and his wife are wondering if they can do an MRI for here.  Explained to them that most insurance companies require you to do conservative therapy prior to getting  the MRI.  I do not feel this is necessary at this time.  He has no cauda equina or dangerous signs of back pain.  He is to apply ice to the area.  He was given Toradol 30 mg IM and Flexeril p.o.  He was given a prescription for Medrol Dosepak, Flexeril, and Vicodin for pain.  He has a Publishing rights manager, Dr. Arnoldo Morale in [place], he should follow-up with his physician.  Or he can follow-up with Dr.Chasnis as his wife requested.  Patient is discharged stable condition in the care of his wife.  Gary Mccullough was evaluated in Emergency Department on 07/24/2021 for the symptoms described in the history of present illness. He was evaluated in the context of the global COVID-19 pandemic, which necessitated consideration that the patient might be at risk for infection with the SARS-CoV-2 virus that causes COVID-19. Institutional protocols and algorithms that pertain to the evaluation of patients at risk for COVID-19 are in a state of rapid change based on information released by regulatory bodies including the CDC and federal and state organizations. These policies and algorithms were followed during the patient's care in the ED.    As part of my  medical decision making, I reviewed the following data within the [facility] notes reviewed and incorporated, Old chart reviewed, Radiograph reviewed , Notes from prior ED visits, and [place] Controlled Substance Database  ____________________________________________   FINAL CLINICAL IMPRESSION(S) / ED DIAGNOSES  Final diagnoses:  Acute mid back pain      NEW MEDICATIONS STARTED DURING THIS VISIT:  New Prescriptions   CYCLOBENZAPRINE (FLEXERIL) 10 MG TABLET    Take 1 tablet (10 mg total) by mouth 3 (three) times daily as needed.   HYDROCODONE-ACETAMINOPHEN (NORCO/VICODIN) 5-325 MG TABLET    Take 1 tablet by mouth every 6 (six) hours as needed for moderate pain.   METHYLPREDNISOLONE (MEDROL DOSEPAK) 4 MG TBPK TABLET    Take 6 pills on day one then decrease by 1 pill each day     Note:  This document was prepared using Dragon voice recognition software and may include unintentional dictation errors.    Versie Starks, PA-C 07/24/21 1413    Lucrezia Starch, MD 07/24/21 1435

## 2021-07-24 NOTE — Discharge Instructions (Signed)
Follow-up with your regular orthopedist or Dr. Sharlet Salina, please call for an appointment Take the medication as prescribed Apply ice to your back, do not use heat

## 2021-08-07 DIAGNOSIS — M62830 Muscle spasm of back: Secondary | ICD-10-CM | POA: Diagnosis not present

## 2021-08-07 DIAGNOSIS — M419 Scoliosis, unspecified: Secondary | ICD-10-CM | POA: Diagnosis not present

## 2021-09-01 DIAGNOSIS — L408 Other psoriasis: Secondary | ICD-10-CM | POA: Diagnosis not present

## 2021-09-01 DIAGNOSIS — L309 Dermatitis, unspecified: Secondary | ICD-10-CM | POA: Diagnosis not present

## 2021-11-22 ENCOUNTER — Encounter: Payer: Self-pay | Admitting: Internal Medicine

## 2021-11-22 ENCOUNTER — Other Ambulatory Visit: Payer: Self-pay

## 2021-11-22 ENCOUNTER — Ambulatory Visit (INDEPENDENT_AMBULATORY_CARE_PROVIDER_SITE_OTHER): Payer: BC Managed Care – PPO | Admitting: Internal Medicine

## 2021-11-22 VITALS — BP 124/74 | HR 97 | Temp 98.0°F | Ht 75.0 in | Wt 206.2 lb

## 2021-11-22 DIAGNOSIS — R5383 Other fatigue: Secondary | ICD-10-CM

## 2021-11-22 DIAGNOSIS — Z113 Encounter for screening for infections with a predominantly sexual mode of transmission: Secondary | ICD-10-CM

## 2021-11-22 DIAGNOSIS — Z23 Encounter for immunization: Secondary | ICD-10-CM

## 2021-11-22 DIAGNOSIS — K760 Fatty (change of) liver, not elsewhere classified: Secondary | ICD-10-CM

## 2021-11-22 DIAGNOSIS — R7301 Impaired fasting glucose: Secondary | ICD-10-CM | POA: Diagnosis not present

## 2021-11-22 DIAGNOSIS — E785 Hyperlipidemia, unspecified: Secondary | ICD-10-CM

## 2021-11-22 DIAGNOSIS — K7689 Other specified diseases of liver: Secondary | ICD-10-CM

## 2021-11-22 DIAGNOSIS — Z0000 Encounter for general adult medical examination without abnormal findings: Secondary | ICD-10-CM | POA: Diagnosis not present

## 2021-11-22 NOTE — Progress Notes (Signed)
The patient is here for annual preventive examination and management of other chronic and acute problems.  This visit occurred during the SARS-CoV-2 public health emergency.  Safety protocols were in place, including screening questions prior to the visit, additional usage of staff PPE, and extensive cleaning of exam room while observing appropriate contact time as indicated for disinfecting solutions.     The risk factors are reflected in the social history.   The roster of all physicians providing medical care to patient - is listed in the Snapshot section of the chart.   Activities of daily living:  The patient is 100% independent in all ADLs: dressing, toileting, feeding as well as independent mobility   Home safety : The patient has smoke detectors in the home. They wear seatbelts.  There are no unsecured firearms at home. There is no violence in the home.    There is no risks for hepatitis, STDs or HIV. There is no   history of blood transfusion. They have no travel history to infectious disease endemic areas of the world.   The patient has seen their dentist in the last six month. They have seen their eye doctor in the last year. The patinet  denies slight hearing difficulty with regard to whispered voices and some television programs.  They have deferred audiologic testing in the last year.  They do not  have excessive sun exposure. Discussed the need for sun protection: hats, long sleeves and use of sunscreen if there is significant sun exposure.    Diet: the importance of a healthy diet is discussed. They do have a healthy diet.   The benefits of regular aerobic exercise were discussed. The patient  exercises  3 to 5 days per week  for  60 minutes.    Depression screen: there are no signs or vegative symptoms of depression- irritability, change in appetite, anhedonia, sadness/tearfullness.   The following portions of the patient's history were reviewed and updated as appropriate:  allergies, current medications, past family history, past medical history,  past surgical history, past social history  and problem list.   Visual acuity was not assessed per patient preference since the patient has regular follow up with an  ophthalmologist. Hearing and body mass index were assessed and reviewed.    During the course of the visit the patient was educated and counseled about appropriate screening and preventive services including : fall prevention , diabetes screening, nutrition counseling, colorectal cancer screening, and recommended immunizations.    Chief Complaint:  1) fatty liver   seen by [facility],  liver enzymes normal.  Fibroscan and MRI done   2) recent  psoriasis outbreak.  Occurred one day after trimming diseased holly bushes in his yard.  Saw dermatology,  had a biopsy  psoriasis right elbow.  Treated with  flunisolone cream by  at [place]  3) mid /upper thoracic back pain episode brought on by golfing.  Treated in ER  after chest x ray.    Review of Symptoms  Patient denies headache, fevers, malaise, unintentional weight loss, skin rash, eye pain, sinus congestion and sinus pain, sore throat, dysphagia,  hemoptysis , cough, dyspnea, wheezing, chest pain, palpitations, orthopnea, edema, abdominal pain, nausea, melena, diarrhea, constipation, flank pain, dysuria, hematuria, urinary  Frequency, nocturia, numbness, tingling, seizures,  Focal weakness, Loss of consciousness,  Tremor, insomnia, depression, anxiety, and suicidal ideation.    Physical Exam:  BP 124/74 (BP Location: Left Arm, Patient Position: Sitting, Cuff Size: Large)  Pulse 97    Temp 98 F (36.7 C) (Oral)    Ht 6\' 3"  (1.905 m)    Wt 206 lb 3.2 oz (93.5 kg)    SpO2 97%    BMI 25.77 kg/m    General appearance: alert, cooperative and appears stated age Ears: normal TM's and external ear canals both ears Throat: lips, mucosa, and tongue normal; teeth and gums normal Neck: no adenopathy, no  carotid bruit, supple, symmetrical, trachea midline and thyroid not enlarged, symmetric, no tenderness/mass/nodules Back: symmetric, no curvature. ROM normal. No CVA tenderness. Lungs: clear to auscultation bilaterally Heart: regular rate and rhythm, S1, S2 normal, no murmur, click, rub or gallop Abdomen: soft, non-tender; bowel sounds normal; no masses,  no organomegaly Pulses: 2+ and symmetric Skin: Skin color, texture, turgor normal. No rashes or lesions Lymph nodes: Cervical, supraclavicular, and axillary nodes normal.   Assessment and Plan:  Fatty infiltration of liver S/p repeat evaluation by [facility].  LFTs were normal in July 2022.   Encounter for preventive health examination age appropriate education and counseling updated, referrals for preventative services and immunizations addressed, dietary and smoking counseling addressed, most recent labs reviewed.  I have personally reviewed and have noted:   1) the patient's medical and social history 2) The pt's use of alcohol, tobacco, and illicit drugs 3) The patient's current medications and supplements 4) Functional ability including ADL's, fall risk, home safety risk, hearing and visual impairment 5) Diet and physical activities 6) Evidence for depression or mood disorder 7) The patient's height, weight, and BMI have been recorded in the chart   I have made referrals, and provided counseling and education based on review of the above  Benign liver cyst Evaluated again by US/MRI at [facility] July 2022.  No change    Updated Medication List Outpatient Encounter Medications as of 11/22/2021  Medication Sig   aspirin 325 MG tablet Take 325 mg by mouth every 6 (six) hours as needed.    cetirizine (ZYRTEC) 10 MG tablet Take 10 mg by mouth daily as needed.    famotidine (PEPCID) 10 MG tablet Take 10 mg by mouth 2 (two) times daily.   fluticasone (FLONASE) 50 MCG/ACT nasal spray Place into both nostrils daily. AS NEEDED    Glucosamine-Chondroitin (GLUCOSAMINE CHONDR COMPLEX PO) Take 1 capsule by mouth daily.   Multiple Vitamin (MULTI-VITAMINS) TABS Take by mouth.   Omega-3 Fatty Acids (FISH OIL) 1000 MG CAPS Take by mouth.   TURMERIC PO Take 2 capsules by mouth daily.   [DISCONTINUED] cyclobenzaprine (FLEXERIL) 10 MG tablet Take 1 tablet (10 mg total) by mouth 3 (three) times daily as needed.   [DISCONTINUED] HYDROcodone-acetaminophen (NORCO/VICODIN) 5-325 MG tablet Take 1 tablet by mouth every 6 (six) hours as needed for moderate pain.   [DISCONTINUED] methylPREDNISolone (MEDROL DOSEPAK) 4 MG TBPK tablet Take 6 pills on day one then decrease by 1 pill each day   No facility-administered encounter medications on file as of 11/22/2021.

## 2021-11-22 NOTE — Assessment & Plan Note (Signed)

## 2021-11-22 NOTE — Assessment & Plan Note (Signed)
S/p repeat evaluation by [facility].  LFTs were normal in July 2022.

## 2021-11-22 NOTE — Patient Instructions (Signed)
You received your tetanus booster today  DO GET AN EYE EXAM  RETURN for fasting labs

## 2021-11-22 NOTE — Assessment & Plan Note (Signed)
Evaluated again by US/MRI at [facility] July 2022.  No change

## 2021-11-29 ENCOUNTER — Other Ambulatory Visit: Payer: Self-pay

## 2021-11-29 ENCOUNTER — Other Ambulatory Visit (INDEPENDENT_AMBULATORY_CARE_PROVIDER_SITE_OTHER): Payer: BC Managed Care – PPO

## 2021-11-29 DIAGNOSIS — R5383 Other fatigue: Secondary | ICD-10-CM

## 2021-11-29 DIAGNOSIS — E785 Hyperlipidemia, unspecified: Secondary | ICD-10-CM | POA: Diagnosis not present

## 2021-11-29 DIAGNOSIS — Z113 Encounter for screening for infections with a predominantly sexual mode of transmission: Secondary | ICD-10-CM | POA: Diagnosis not present

## 2021-11-29 DIAGNOSIS — R7301 Impaired fasting glucose: Secondary | ICD-10-CM

## 2021-11-29 DIAGNOSIS — K760 Fatty (change of) liver, not elsewhere classified: Secondary | ICD-10-CM | POA: Diagnosis not present

## 2021-11-29 LAB — CBC WITH DIFFERENTIAL/PLATELET
Basophils Absolute: 0 10*3/uL (ref 0.0–0.1)
Basophils Relative: 0.4 % (ref 0.0–3.0)
Eosinophils Absolute: 0.3 10*3/uL (ref 0.0–0.7)
Eosinophils Relative: 3.9 % (ref 0.0–5.0)
HCT: 42.4 % (ref 39.0–52.0)
Hemoglobin: 14.1 g/dL (ref 13.0–17.0)
Lymphocytes Relative: 46.8 % — ABNORMAL HIGH (ref 12.0–46.0)
Lymphs Abs: 3.3 10*3/uL (ref 0.7–4.0)
MCHC: 33.2 g/dL (ref 30.0–36.0)
MCV: 96.3 fl (ref 78.0–100.0)
Monocytes Absolute: 0.5 10*3/uL (ref 0.1–1.0)
Monocytes Relative: 7.5 % (ref 3.0–12.0)
Neutro Abs: 3 10*3/uL (ref 1.4–7.7)
Neutrophils Relative %: 41.4 % — ABNORMAL LOW (ref 43.0–77.0)
Platelets: 267 10*3/uL (ref 150.0–400.0)
RBC: 4.41 Mil/uL (ref 4.22–5.81)
RDW: 13.2 % (ref 11.5–15.5)
WBC: 7.2 10*3/uL (ref 4.0–10.5)

## 2021-11-29 LAB — COMPREHENSIVE METABOLIC PANEL
ALT: 50 U/L (ref 0–53)
AST: 30 U/L (ref 0–37)
Albumin: 4.6 g/dL (ref 3.5–5.2)
Alkaline Phosphatase: 55 U/L (ref 39–117)
BUN: 10 mg/dL (ref 6–23)
CO2: 28 mEq/L (ref 19–32)
Calcium: 9.5 mg/dL (ref 8.4–10.5)
Chloride: 102 mEq/L (ref 96–112)
Creatinine, Ser: 0.87 mg/dL (ref 0.40–1.50)
GFR: 101.48 mL/min (ref >60.00)
Glucose, Bld: 89 mg/dL (ref 70–99)
Potassium: 4.3 mEq/L (ref 3.5–5.1)
Sodium: 137 mEq/L (ref 135–145)
Total Bilirubin: 0.6 mg/dL (ref 0.2–1.2)
Total Protein: 7.2 g/dL (ref 6.0–8.3)

## 2021-11-29 LAB — LIPID PANEL
Cholesterol: 197 mg/dL (ref 0–200)
HDL: 55.3 mg/dL (ref >39.00)
LDL Cholesterol: 113 mg/dL — ABNORMAL HIGH (ref 0–99)
NonHDL: 141.8
Total CHOL/HDL Ratio: 4
Triglycerides: 145 mg/dL (ref 0.0–149.0)
VLDL: 29 mg/dL (ref 0.0–40.0)

## 2021-11-29 LAB — MICROALBUMIN / CREATININE URINE RATIO
Creatinine,U: 243 mg/dL
Microalb Creat Ratio: 0.5 mg/g (ref 0.0–30.0)
Microalb, Ur: 1.1 mg/dL (ref 0.0–1.9)

## 2021-11-29 LAB — TSH: TSH: 3.13 u[IU]/mL (ref 0.35–5.50)

## 2021-11-29 LAB — GAMMA GT: GGT: 29 U/L (ref 7–51)

## 2021-11-30 LAB — HIV ANTIBODY (ROUTINE TESTING W REFLEX): HIV 1&2 Ab, 4th Generation: NONREACTIVE

## 2022-01-01 DIAGNOSIS — L400 Psoriasis vulgaris: Secondary | ICD-10-CM | POA: Diagnosis not present

## 2022-03-03 ENCOUNTER — Ambulatory Visit
Admission: EM | Admit: 2022-03-03 | Discharge: 2022-03-03 | Disposition: A | Discharge status: Home / Self Care | Payer: BC Managed Care – PPO | Attending: Emergency Medicine | Admitting: Emergency Medicine

## 2022-03-03 DIAGNOSIS — J0100 Acute maxillary sinusitis, unspecified: Secondary | ICD-10-CM | POA: Diagnosis not present

## 2022-03-03 DIAGNOSIS — J209 Acute bronchitis, unspecified: Secondary | ICD-10-CM | POA: Diagnosis not present

## 2022-03-03 LAB — POCT RAPID STREP A (OFFICE): Rapid Strep A Screen: NEGATIVE

## 2022-03-03 MED ORDER — AZITHROMYCIN 250 MG PO TABS: 250.0000 mg | ORAL_TABLET | Freq: Every day | ORAL | 0 refills | Status: DC

## 2022-03-03 MED ORDER — BENZONATATE 100 MG PO CAPS: 100.0000 mg | ORAL_CAPSULE | Freq: Three times a day (TID) | ORAL | 0 refills | Status: DC | PRN

## 2022-03-03 NOTE — ED Provider Notes (Signed)
Gary Mccullough    CSN: [id number] Arrival date & time: 03/03/22  1349      History   Chief Complaint Chief Complaint  Patient presents with   Fever   Sore Throat   Nasal Congestion   Diarrhea    HPI Gary Mccullough is a 51 y.o. male.  Patient presents with 1 week history of fever, congestion, postnasal drip, runny nose, sore throat, cough, diarrhea.  Patient states his cough is occasionally productive of clear sputum but he has had a couple of episodes of brown sputum.  He has history of smoking but quit 9 years ago.  Treating symptoms at home with Mucinex cold and flu.  No shortness of breath, vomiting, rash, or other symptoms.  The history is provided by the patient and medical records.   Past Medical History:  Diagnosis Date   Alcohol abuse    Last Heavy Use 2014   Allergy    Cervical stenosis of spine    Chicken pox    Fatty liver    Liver hemangioma    Seizures ([place])    Spondylisthesis     Patient Active Problem List   Diagnosis Date Noted   Colon cancer screening 11/21/2020   Encounter for preventive health examination 11/15/2018   Bleeding hemorrhoid 08/12/2016   Spondylolysis of lumbar region 08/12/2016   Benign liver cyst 08/12/2016   Renal cyst, acquired, left 08/12/2016   Hemangioma of liver 08/10/2016   History of drug withdrawal syndrome 05/12/2016   Irritable bowel syndrome (IBS) 01/27/2016   History of tobacco abuse 08/06/2015   Allergic rhinitis 06/10/2014   Cervical spinal stenosis 06/10/2014   Acid reflux 06/10/2014   Fatty infiltration of liver 06/10/2014    Past Surgical History:  Procedure Laterality Date   FINGER SURGERY  1980's   [place]  2013   Dr, [place] Medications    Prior to Admission medications   Medication Sig Start Date End Date Taking? Authorizing Provider  azithromycin (ZITHROMAX) 250 MG tablet Take 1  tablet (250 mg total) by mouth daily. Take first 2 tablets together, then 1 every day until finished. 03/03/22  Yes Sharion Balloon, NP  benzonatate (TESSALON) 100 MG capsule Take 1 capsule (100 mg total) by mouth 3 (three) times daily as needed for cough. 03/03/22  Yes Sharion Balloon, NP  aspirin 325 MG tablet Take 325 mg by mouth every 6 (six) hours as needed.     [provider]  cetirizine (ZYRTEC) 10 MG tablet Take 10 mg by mouth daily as needed.     [provider]  famotidine (PEPCID) 10 MG tablet Take 10 mg by mouth 2 (two) times daily.    [provider]  fluticasone (FLONASE) 50 MCG/ACT nasal spray Place into both nostrils daily. AS NEEDED    [provider]  Glucosamine-Chondroitin (GLUCOSAMINE CHONDR COMPLEX PO) Take 1 capsule by mouth daily.    [provider]  Multiple Vitamin (MULTI-VITAMINS) TABS Take by mouth.    [provider]  Omega-3 Fatty Acids (FISH OIL) 1000 MG CAPS Take by mouth.    [provider]  TURMERIC PO Take 2 capsules by mouth daily.    [provider]    Family History Family History  Problem Relation Age of Onset   CVA Father  Alcohol abuse Father    Gout Brother     Social History Social History   Tobacco Use   Smoking status: Former    Packs/day: 1.00    Types: Cigarettes    Start date: 08/02/1993    Quit date: 06/02/2013    Years since quitting: 8.7   Smokeless tobacco: Current   Tobacco comments:    Vapes for the past 2 years   Vaping Use   Vaping Use: Every day  Substance Use Topics   Alcohol use: Not Currently    Alcohol/week: 0.0 standard drinks    Comment: Occasional   Drug use: No     Allergies   Patient has no known allergies.   Review of Systems Review of Systems  Constitutional:  Positive for fever. Negative for chills.  HENT:  Positive for congestion, postnasal drip, rhinorrhea and sore throat. Negative for ear pain.   Respiratory:  Positive for cough.  Negative for shortness of breath.   Gastrointestinal:  Positive for diarrhea. Negative for vomiting.  Skin:  Negative for color change and rash.  All other systems reviewed and are negative.   Physical Exam Triage Vital Signs ED Triage Vitals  Enc Vitals Group     BP      Pulse      Resp      Temp      Temp src      SpO2      Weight      Height      Head Circumference      Peak Flow      Pain Score      Pain Loc      Pain Edu?      Excl. in [place]?    No data found.  Updated Vital Signs BP (!) 145/86 (BP Location: Left Arm)   Pulse 95   Temp 97.9 F (36.6 C) (Temporal)   Resp 18   SpO2 100%   Visual Acuity Right Eye Distance:   Left Eye Distance:   Bilateral Distance:    Right Eye Near:   Left Eye Near:    Bilateral Near:     Physical Exam Vitals and nursing note reviewed.  Constitutional:      General: He is not in acute distress.    Appearance: Normal appearance. He is well-developed. He is not ill-appearing.  HENT:     Right Ear: Tympanic membrane normal.     Left Ear: Tympanic membrane normal.     Nose: Congestion present.     Mouth/Throat:     Mouth: Mucous membranes are moist.     Pharynx: Oropharynx is clear.  Cardiovascular:     Rate and Rhythm: Normal rate and regular rhythm.     Heart sounds: Normal heart sounds.  Pulmonary:     Effort: Pulmonary effort is normal. No respiratory distress.     Breath sounds: Normal breath sounds.  Musculoskeletal:     Cervical back: Neck supple.  Skin:    General: Skin is warm and dry.  Neurological:     Mental Status: He is alert.  Psychiatric:        Mood and Affect: Mood normal.        Behavior: Behavior normal.     UC Treatments / Results  Labs (all labs ordered are listed, but only abnormal results are displayed) Labs Reviewed  POCT RAPID STREP A (OFFICE)    EKG   Radiology No results found.  Procedures Procedures (including  critical care time)  Medications Ordered in UC Medications  - No data to display  Initial Impression / Assessment and Plan / UC Course  I have reviewed the triage vital signs and the nursing notes.  Pertinent labs & imaging results that were available during my care of the patient were reviewed by me and considered in my medical decision making (see chart for details).    Acute bronchitis and sinusitis.  Patient agrees to follow up with his PCP next week for evaluation of the brown sputum with his history of smoking.  Treating today with Zithromax and Tessalon Perles.  Education provided on bronchitis and sinusitis.  Patient agrees to plan of care.   Final Clinical Impressions(s) / UC Diagnoses   Final diagnoses:  Acute bronchitis, unspecified organism  Acute non-recurrent maxillary sinusitis     Discharge Instructions      Take the Zithromax and Tessalon Perles as directed.  Follow up with your primary care provider.        ED Prescriptions     Medication Sig Dispense Auth. Provider   azithromycin (ZITHROMAX) 250 MG tablet Take 1 tablet (250 mg total) by mouth daily. Take first 2 tablets together, then 1 every day until finished. 6 tablet Sharion Balloon, NP   benzonatate (TESSALON) 100 MG capsule Take 1 capsule (100 mg total) by mouth 3 (three) times daily as needed for cough. 21 capsule Sharion Balloon, NP      PDMP not reviewed this encounter.   Sharion Balloon, NP 03/03/22 1426

## 2022-03-03 NOTE — Discharge Instructions (Addendum)
Take the Zithromax and Tessalon Perles as directed.  Follow up with your primary care provider.

## 2022-03-03 NOTE — ED Triage Notes (Signed)
Patient presents to Urgent Care with complaints of fever, sore throat, nasal congestion, and diarrhea x 1 week. Treating symptoms with mucinex and cold flu meds with no improvement.

## 2022-03-05 ENCOUNTER — Encounter: Payer: Self-pay | Admitting: Internal Medicine

## 2022-03-06 ENCOUNTER — Encounter: Payer: Self-pay | Admitting: Internal Medicine

## 2022-03-06 ENCOUNTER — Ambulatory Visit (INDEPENDENT_AMBULATORY_CARE_PROVIDER_SITE_OTHER): Payer: BC Managed Care – PPO

## 2022-03-06 ENCOUNTER — Ambulatory Visit: Payer: BC Managed Care – PPO | Admitting: Internal Medicine

## 2022-03-06 VITALS — BP 132/82 | HR 98 | Temp 97.3°F | Ht 75.0 in | Wt 205.6 lb

## 2022-03-06 DIAGNOSIS — R059 Cough, unspecified: Secondary | ICD-10-CM | POA: Diagnosis not present

## 2022-03-06 DIAGNOSIS — J029 Acute pharyngitis, unspecified: Secondary | ICD-10-CM | POA: Diagnosis not present

## 2022-03-06 DIAGNOSIS — R509 Fever, unspecified: Secondary | ICD-10-CM | POA: Diagnosis not present

## 2022-03-06 DIAGNOSIS — J22 Unspecified acute lower respiratory infection: Secondary | ICD-10-CM

## 2022-03-06 DIAGNOSIS — J988 Other specified respiratory disorders: Secondary | ICD-10-CM | POA: Diagnosis not present

## 2022-03-06 LAB — CBC WITH DIFFERENTIAL/PLATELET
Basophils Absolute: 0 10*3/uL (ref 0.0–0.1)
Basophils Relative: 0.5 % (ref 0.0–3.0)
Eosinophils Absolute: 0.2 10*3/uL (ref 0.0–0.7)
Eosinophils Relative: 3 % (ref 0.0–5.0)
HCT: 41.3 % (ref 39.0–52.0)
Hemoglobin: 14.3 g/dL (ref 13.0–17.0)
Lymphocytes Relative: 43.3 % (ref 12.0–46.0)
Lymphs Abs: 2.6 10*3/uL (ref 0.7–4.0)
MCHC: 34.5 g/dL (ref 30.0–36.0)
MCV: 93.8 fl (ref 78.0–100.0)
Monocytes Absolute: 0.5 10*3/uL (ref 0.1–1.0)
Monocytes Relative: 7.5 % (ref 3.0–12.0)
Neutro Abs: 2.8 10*3/uL (ref 1.4–7.7)
Neutrophils Relative %: 45.7 % (ref 43.0–77.0)
Platelets: 361 10*3/uL (ref 150.0–400.0)
RBC: 4.41 Mil/uL (ref 4.22–5.81)
RDW: 12.2 % (ref 11.5–15.5)
WBC: 6.1 10*3/uL (ref 4.0–10.5)

## 2022-03-06 LAB — MONONUCLEOSIS SCREEN: Mono Screen: NEGATIVE

## 2022-03-06 NOTE — Telephone Encounter (Signed)
Pt called back and scheduled an appt

## 2022-03-06 NOTE — Assessment & Plan Note (Signed)
Given headache and pharyngitis ,  Will  add monospot and CBC.  Chest x ray to rule out CAP.

## 2022-03-06 NOTE — Telephone Encounter (Signed)
LMTCB. Need to schedule pt for a same day acute appt.

## 2022-03-06 NOTE — Patient Instructions (Addendum)
Dextromethorphan can been added to tessalon perles (the cough capsules )  if the 200 mg dose of perles  does not help control your cough  Complete the z pack  If chest  x ray shows pneumonia.  You'll receive a 2nd antibiotic to take for 7 days   If normal  I'll add a prednisone taper to help resolve cough

## 2022-03-06 NOTE — Progress Notes (Signed)
Subjective:  Patient ID: Gary Mccullough, male    DOB: Feb 24, 1971  Age: 51 y.o. MRN: [id number]  CC: The primary encounter diagnosis was Lower respiratory infection. Diagnoses of Pharyngitis, unspecified etiology and Respiratory infection were also pertinent to this visit.   HPI Gary Mccullough presents for  Chief Complaint  Patient presents with   Acute Visit    1 week ago pt mowed a wooded area and two days later developed head congestion, fever, diarrhea, and productive cough coughing up brown phlegm. He went to [facility] and they prescribed him a z pak which is on the 4th day of it.   \  Treated by urgent Care pn Saturday with azithromycin for sudden onset of pharyngitis,  headache,  sinus congestion, diarrhea ,fever   to 101.7 and cough productive of purulent sputum after working in a wooded area.   Strep test was negative  .  Cough has improved but sputum still has  brown specks in it.  currently on day 4 of abx.  No wheezing or paroxysms of cough .  Fevers has resolved but still having body aches and subjective fevers. Daughter is home from college,  but denies sick contacts. Sleep not dsirupted by cough but still having sweats.     Outpatient Medications Prior to Visit  Medication Sig Dispense Refill   aspirin 325 MG tablet Take 325 mg by mouth every 6 (six) hours as needed.      azithromycin (ZITHROMAX) 250 MG tablet Take 1 tablet (250 mg total) by mouth daily. Take first 2 tablets together, then 1 every day until finished. 6 tablet 0   benzonatate (TESSALON) 100 MG capsule Take 1 capsule (100 mg total) by mouth 3 (three) times daily as needed for cough. 21 capsule 0   cetirizine (ZYRTEC) 10 MG tablet Take 10 mg by mouth daily as needed.      famotidine (PEPCID) 10 MG tablet Take 10 mg by mouth 2 (two) times daily.     fluticasone (FLONASE) 50 MCG/ACT nasal spray Place into both nostrils daily. AS NEEDED     Glucosamine-Chondroitin (GLUCOSAMINE CHONDR COMPLEX PO) Take 1 capsule  by mouth daily.     Multiple Vitamin (MULTI-VITAMINS) TABS Take by mouth.     Omega-3 Fatty Acids (FISH OIL) 1000 MG CAPS Take by mouth.     TURMERIC PO Take 2 capsules by mouth daily.     No facility-administered medications prior to visit.    Review of Systems;  Patient denies headache, fevers, malaise, unintentional weight loss, skin rash, eye pain, sinus congestion and sinus pain, sore throat, dysphagia,  hemoptysis , cough, dyspnea, wheezing, chest pain, palpitations, orthopnea, edema, abdominal pain, nausea, melena, diarrhea, constipation, flank pain, dysuria, hematuria, urinary  Frequency, nocturia, numbness, tingling, seizures,  Focal weakness, Loss of consciousness,  Tremor, insomnia, depression, anxiety, and suicidal ideation.      Objective:  BP 132/82 (BP Location: Left Arm, Patient Position: Sitting, Cuff Size: Normal)   Pulse 98   Temp (!) 97.3 F (36.3 C) (Oral)   Ht '6\' 3"'$  (1.905 m)   Wt 205 lb 9.6 oz (93.3 kg)   SpO2 99%   BMI 25.70 kg/m   BP Readings from Last 3 Encounters:  03/06/22 132/82  03/03/22 (!) 145/86  11/22/21 124/74    Wt Readings from Last 3 Encounters:  03/06/22 205 lb 9.6 oz (93.3 kg)  11/22/21 206 lb 3.2 oz (93.5 kg)  07/24/21 195 lb (88.5 kg)    General  appearance: alert, cooperative and appears stated age Ears: normal TM's and external ear canals both ears Throat: lips, mucosa, and tongue normal; teeth and gums normal Neck: no adenopathy, no carotid bruit, supple, symmetrical, trachea midline and thyroid not enlarged, symmetric, no tenderness/mass/nodules Back: symmetric, no curvature. ROM normal. No CVA tenderness. Lungs: clear to auscultation bilaterally Heart: regular rate and rhythm, S1, S2 normal, no murmur, click, rub or gallop Abdomen: soft, non-tender; bowel sounds normal; no masses,  no organomegaly Pulses: 2+ and symmetric Skin: Skin color, texture, turgor normal. No rashes or lesions Lymph nodes: Cervical, supraclavicular,  and axillary nodes normal.  Lab Results  Component Value Date   HGBA1C 5.4 01/25/2016    Lab Results  Component Value Date   CREATININE 0.87 11/29/2021   CREATININE 0.88 11/21/2020   CREATININE 0.94 11/17/2019    Lab Results  Component Value Date   WBC 7.2 11/29/2021   HGB 14.1 11/29/2021   HCT 42.4 11/29/2021   PLT 267.0 11/29/2021   GLUCOSE 89 11/29/2021   CHOL 197 11/29/2021   TRIG 145.0 11/29/2021   HDL 55.30 11/29/2021   LDLCALC 113 (H) 11/29/2021   ALT 50 11/29/2021   AST 30 11/29/2021   NA 137 11/29/2021   K 4.3 11/29/2021   CL 102 11/29/2021   CREATININE 0.87 11/29/2021   BUN 10 11/29/2021   CO2 28 11/29/2021   TSH 3.13 11/29/2021   HGBA1C 5.4 01/25/2016   MICROALBUR 1.1 11/29/2021    No results found.  Assessment & Plan:   Problem List Items Addressed This Visit     Respiratory infection    Given headache and pharyngitis ,  Will  add monospot and CBC.  Chest x ray to rule out CAP.         Other Visit Diagnoses     Lower respiratory infection    -  Primary   Relevant Orders   DG Chest 2 View   Pharyngitis, unspecified etiology       Relevant Orders   CBC with Differential/Platelet   Monospot       I spent a total of  18 minutes with this patient in a face to face visit on the date of this encounter reviewing the last office visit with Urgent Care   and post visit ordering of testing and therapeutics.    Follow-up: No follow-ups on file.   [name], MD

## 2022-03-12 ENCOUNTER — Encounter: Payer: Self-pay | Admitting: Internal Medicine

## 2022-03-12 ENCOUNTER — Telehealth: Payer: Self-pay

## 2022-03-12 MED ORDER — PREDNISONE 10 MG PO TABS: ORAL_TABLET | ORAL | 0 refills | Status: DC

## 2022-03-12 MED ORDER — CHERATUSSIN AC 100-10 MG/5ML PO SOLN: 10.0000 mL | Freq: Every evening | ORAL | 0 refills | Status: DC | PRN

## 2022-03-12 NOTE — Telephone Encounter (Signed)
Spoke with pt and informed him to continue with the antihistamines and delsym for the cough. Pt stated that he would continue with those but would like to know how long should he continue to let the symptoms continue. Pt stated that he has been sick for two weeks and is "tired of feeling this way".

## 2022-03-12 NOTE — Telephone Encounter (Signed)
Spoke with pt and informed him of the additional medication that Dr. Derrel Nip has sent in. Pt gave a verbal understanding.

## 2022-03-12 NOTE — Telephone Encounter (Signed)
Update Data processing manager First) View All Conversations on this Encounter Gary Mccullough Current  P [facility] Clinical Pool (supporting [name], MD) 1 hour ago (10:18 AM)    Dr. Derrel Nip, I feel a little better but not 100%. Still have a runny nose, cough that is productive, sore throat, headache, sinus feels raw and lack of energy. Took my last dose of Zithromax on 6/7. Should I continue with OTC meds or do you have a better idea? Jenny Reichmann

## 2022-03-12 NOTE — Telephone Encounter (Signed)
Telephone encounter created  

## 2022-03-12 NOTE — Addendum Note (Signed)
Addended by: [name] on: 03/12/2022 05:23 PM   Modules accepted: Orders

## 2022-03-13 NOTE — Telephone Encounter (Signed)
Pt was prescribed prednisone. See telephone encounter.

## 2022-10-20 ENCOUNTER — Ambulatory Visit
Admission: EM | Admit: 2022-10-20 | Discharge: 2022-10-20 | Disposition: A | Discharge status: Home / Self Care | Payer: BC Managed Care – PPO | Attending: Urgent Care | Admitting: Urgent Care

## 2022-10-20 DIAGNOSIS — J019 Acute sinusitis, unspecified: Secondary | ICD-10-CM | POA: Diagnosis not present

## 2022-10-20 MED ORDER — AMOXICILLIN-POT CLAVULANATE 875-125 MG PO TABS: 1.0000 | ORAL_TABLET | Freq: Two times a day (BID) | ORAL | 0 refills | Status: DC

## 2022-10-20 MED ORDER — PREDNISONE 20 MG PO TABS
ORAL_TABLET | ORAL | 0 refills | Status: AC
Start: 2022-10-20 — End: 2022-10-25

## 2022-10-20 NOTE — ED Provider Notes (Signed)
[name]    CSN: [id number] Arrival date & time: 10/20/22  1219      History   Chief Complaint Chief Complaint  Patient presents with   Nasal Congestion   Sore Throat   Fever    HPI Eragon Hammond is a 52 y.o. male.    Sore Throat  Fever   Patient presents to urgent care with symptoms since 1 week.  States he was working in a "dusty environment" last week with sinus symptoms including pain/pressure behind his eyes, sensitivity to light, headache, nausea and sore throat. States started fever Thursday with tmax 102.  He is using mucinex cold and flu. Reports hx liver disease.  Past Medical History:  Diagnosis Date   Alcohol abuse    Last Heavy Use 2014   Allergy    Cervical stenosis of spine    Chicken pox    Fatty liver    Liver hemangioma    Seizures ([place])    Spondylisthesis     Patient Active Problem List   Diagnosis Date Noted   Respiratory infection 03/06/2022   Colon cancer screening 11/21/2020   Encounter for preventive health examination 11/15/2018   Bleeding hemorrhoid 08/12/2016   Spondylolysis of lumbar region 08/12/2016   Benign liver cyst 08/12/2016   Renal cyst, acquired, left 08/12/2016   Hemangioma of liver 08/10/2016   History of drug withdrawal syndrome 05/12/2016   Irritable bowel syndrome (IBS) 01/27/2016   History of tobacco abuse 08/06/2015   Allergic rhinitis 06/10/2014   Cervical spinal stenosis 06/10/2014   Acid reflux 06/10/2014   Fatty infiltration of liver 06/10/2014    Past Surgical History:  Procedure Laterality Date   FINGER SURGERY  1980's   [place]  2013   Dr, [place] Medications    Prior to Admission medications   Medication Sig Start Date End Date Taking? Authorizing Provider  aspirin 325 MG tablet Take 325 mg by mouth every 6 (six) hours as needed.     [provider]   azithromycin (ZITHROMAX) 250 MG tablet Take 1 tablet (250 mg total) by mouth daily. Take first 2 tablets together, then 1 every day until finished. 03/03/22   Sharion Balloon, NP  benzonatate (TESSALON) 100 MG capsule Take 1 capsule (100 mg total) by mouth 3 (three) times daily as needed for cough. 03/03/22   Sharion Balloon, NP  cetirizine (ZYRTEC) 10 MG tablet Take 10 mg by mouth daily as needed.     [provider]  famotidine (PEPCID) 10 MG tablet Take 10 mg by mouth 2 (two) times daily.    [provider]  fluticasone (FLONASE) 50 MCG/ACT nasal spray Place into both nostrils daily. AS NEEDED    [provider]  Glucosamine-Chondroitin (GLUCOSAMINE CHONDR COMPLEX PO) Take 1 capsule by mouth daily.    [provider]  guaiFENesin-codeine (CHERATUSSIN AC) 100-10 MG/5ML syrup Take 10 mLs by mouth at bedtime as needed for cough. 03/12/22   [name], MD  Multiple Vitamin (MULTI-VITAMINS) TABS Take by mouth.    [provider]  Omega-3 Fatty Acids (FISH OIL) 1000 MG CAPS Take by mouth.    [provider]  predniSONE (DELTASONE) 10 MG tablet 6 tablets on Day 1 , then reduce by 1 tablet daily until gone 03/12/22   Deborra Medina  L, MD  TURMERIC PO Take 2 capsules by mouth daily.    [provider]    Family History Family History  Problem Relation Age of Onset   CVA Father    Alcohol abuse Father    Gout Brother     Social History Social History   Tobacco Use   Smoking status: Former    Packs/day: 1.00    Types: Cigarettes    Start date: 08/02/1993    Quit date: 06/02/2013    Years since quitting: 9.3   Smokeless tobacco: Current   Tobacco comments:    Vapes for the past 2 years   Vaping Use   Vaping Use: Every day  Substance Use Topics   Alcohol use: Not Currently    Alcohol/week: 0.0 standard drinks of alcohol    Comment: Occasional   Drug use: No     Allergies   Patient has no known allergies.   Review of  Systems Review of Systems  Constitutional:  Positive for fever.     Physical Exam Triage Vital Signs ED Triage Vitals  Enc Vitals Group     BP 10/20/22 1227 (!) 148/100     Pulse Rate 10/20/22 1227 89     Resp 10/20/22 1227 18     Temp 10/20/22 1227 98.6 F (37 C)     Temp Source 10/20/22 1227 Oral     SpO2 10/20/22 1227 98 %     Weight --      Height --      Head Circumference --      Peak Flow --      Pain Score 10/20/22 1234 0     Pain Loc --      Pain Edu? --      Excl. in [place]? --    No data found.  Updated Vital Signs BP (!) 148/100 (BP Location: Left Arm)   Pulse 89   Temp 98.6 F (37 C) (Oral)   Resp 18   SpO2 98%   Visual Acuity Right Eye Distance:   Left Eye Distance:   Bilateral Distance:    Right Eye Near:   Left Eye Near:    Bilateral Near:     Physical Exam Vitals reviewed.  Constitutional:      Appearance: He is well-developed. He is ill-appearing.  HENT:     Nose:     Right Sinus: Maxillary sinus tenderness and frontal sinus tenderness present.     Left Sinus: Maxillary sinus tenderness and frontal sinus tenderness present.  Cardiovascular:     Rate and Rhythm: Normal rate and regular rhythm.     Heart sounds: Normal heart sounds.  Pulmonary:     Effort: Pulmonary effort is normal.     Breath sounds: Normal breath sounds.  Skin:    General: Skin is warm and dry.  Neurological:     General: No focal deficit present.     Mental Status: He is alert and oriented to person, place, and time.  Psychiatric:        Mood and Affect: Mood normal.        Behavior: Behavior normal.      UC Treatments / Results  Labs (all labs ordered are listed, but only abnormal results are displayed) Labs Reviewed - No data to display  EKG   Radiology No results found.  Procedures Procedures (including critical care time)  Medications Ordered in UC Medications - No data to display  Initial Impression / Assessment  and Plan / UC Course  I have  reviewed the triage vital signs and the nursing notes.  Pertinent labs & imaging results that were available during my care of the patient were reviewed by me and considered in my medical decision making (see chart for details).   Patient is afebrile here without recent antipyretics. Satting well on room air. Overall is ill appearing, though well hydrated, without respiratory distress. Pulmonary exam is unremarkable.  Lungs CTAB without wheezing, rhonchi, rales. Maxillary sinus tenderness is present.  Unclear etiology for the patient's symptoms.  Possible initial allergic reaction to the "dusty" environment versus unrelated viral sinusitis.  In addition, possibly viral coinfection starting Thursday.  Symptoms are certainly consistent with flu versus COVID, but also concern for bacterial secondary infection.  Given the severity of his sinus symptoms, proposed treatment with a course of prednisone which the patient has stated he is tolerated in the past.  To cover for possible bacterial involvement, will add Augmentin.  Final Clinical Impressions(s) / UC Diagnoses   Final diagnoses:  None   Discharge Instructions   None    ED Prescriptions   None    PDMP not reviewed this encounter.   Rose Phi, [name] 10/20/22 1249

## 2022-10-20 NOTE — ED Triage Notes (Signed)
Pt. Presents to UC w/ c/o nasal congestion, sore throat, a fever and sensitivity to light. Pt. Expresses concern for a sinus infection. Pt. States he was working in a dusty environment last week and his symptoms followed.

## 2022-10-20 NOTE — Discharge Instructions (Addendum)
Follow up here or with your primary care provider if your symptoms are worsening or not improving with treatment.     

## 2022-10-26 ENCOUNTER — Ambulatory Visit
Admission: EM | Admit: 2022-10-26 | Discharge: 2022-10-26 | Disposition: A | Discharge status: Home / Self Care | Payer: BC Managed Care – PPO | Attending: Emergency Medicine | Admitting: Emergency Medicine

## 2022-10-26 DIAGNOSIS — H1032 Unspecified acute conjunctivitis, left eye: Secondary | ICD-10-CM | POA: Diagnosis not present

## 2022-10-26 DIAGNOSIS — J0100 Acute maxillary sinusitis, unspecified: Secondary | ICD-10-CM | POA: Diagnosis not present

## 2022-10-26 DIAGNOSIS — R197 Diarrhea, unspecified: Secondary | ICD-10-CM

## 2022-10-26 MED ORDER — AZITHROMYCIN 250 MG PO TABS: 250.0000 mg | ORAL_TABLET | Freq: Every day | ORAL | 0 refills | Status: DC

## 2022-10-26 MED ORDER — POLYMYXIN B-TRIMETHOPRIM 10000-0.1 UNIT/ML-% OP SOLN: 1.0000 [drp] | Freq: Four times a day (QID) | OPHTHALMIC | 0 refills | Status: AC

## 2022-10-26 NOTE — Discharge Instructions (Addendum)
Use the antibiotic eye drops and take the Zithromax as directed.    Follow the diarrhea diet.    Follow up with your primary care provider if your symptoms are not improving.

## 2022-10-26 NOTE — ED Triage Notes (Addendum)
Patient to Urgent Care with complaints of left sided eye swelling. Reports eye swelling was worse this morning. Noticed some crusty/ discharge this morning.  Prescribed augmentin/ prednisone for a sinus infection 10/20/22. Reports feeling no improvement in symptoms. Fever until Monday/ Tuesday.  Reports abx has caused severe diarrhea and stomach upset.

## 2022-10-26 NOTE — ED Provider Notes (Signed)
[name]    CSN: [id number] Arrival date & time: 10/26/22  1358      History   Chief Complaint Chief Complaint  Patient presents with   Eye Problem    HPI Gary Mccullough is a 52 y.o. male.  Accompanied by his wife, patient presents with left eye discomfort, drainage, crusting in lashes, redness x 1 day.  No eye injury or change in vision.  He also reports ongoing sinus congestion, postnasal drip, sinus pressure.  Patient was seen at this urgent care on 10/20/2022; diagnosed with sinusitis; treated with Augmentin and prednisone.  Patient reports nausea and diarrhea caused by the antibiotic; he completed the prednisone and is still on the Augmentin.  He denies fever, rash, cough, shortness of breath, vomiting, or other symptoms.  His medical history includes irritable bowel syndrome.  The history is provided by the patient, the spouse and medical records.    Past Medical History:  Diagnosis Date   Alcohol abuse    Last Heavy Use 2014   Allergy    Cervical stenosis of spine    Chicken pox    Fatty liver    Liver hemangioma    Seizures ([place])    Spondylisthesis     Patient Active Problem List   Diagnosis Date Noted   Respiratory infection 03/06/2022   Colon cancer screening 11/21/2020   Encounter for preventive health examination 11/15/2018   Bleeding hemorrhoid 08/12/2016   Spondylolysis of lumbar region 08/12/2016   Benign liver cyst 08/12/2016   Renal cyst, acquired, left 08/12/2016   Hemangioma of liver 08/10/2016   History of drug withdrawal syndrome 05/12/2016   Irritable bowel syndrome (IBS) 01/27/2016   History of tobacco abuse 08/06/2015   Allergic rhinitis 06/10/2014   Cervical spinal stenosis 06/10/2014   Acid reflux 06/10/2014   Fatty infiltration of liver 06/10/2014    Past Surgical History:  Procedure Laterality Date   FINGER SURGERY  1980's   [place]  2013   Dr,  [place] Medications    Prior to Admission medications   Medication Sig Start Date End Date Taking? Authorizing Provider  azithromycin (ZITHROMAX) 250 MG tablet Take 1 tablet (250 mg total) by mouth daily. Take first 2 tablets together, then 1 every day until finished. 10/26/22  Yes Sharion Balloon, NP  trimethoprim-polymyxin b (POLYTRIM) ophthalmic solution Place 1 drop into both eyes 4 (four) times daily for 7 days. 10/26/22 11/02/22 Yes Sharion Balloon, NP  aspirin 325 MG tablet Take 325 mg by mouth every 6 (six) hours as needed.     [provider]  cetirizine (ZYRTEC) 10 MG tablet Take 10 mg by mouth daily as needed.     [provider]  famotidine (PEPCID) 10 MG tablet Take 10 mg by mouth 2 (two) times daily.    [provider]  fluticasone (FLONASE) 50 MCG/ACT nasal spray Place into both nostrils daily. AS NEEDED    [provider]  Glucosamine-Chondroitin (GLUCOSAMINE CHONDR COMPLEX PO) Take 1 capsule by mouth daily.    [provider]  Multiple Vitamin (MULTI-VITAMINS) TABS Take by mouth.    [provider]  Omega-3 Fatty Acids (FISH OIL) 1000 MG CAPS Take by mouth.    [provider]  TURMERIC PO Take 2 capsules by mouth daily.    [provider]    Family History Family History  Problem Relation Age of Onset   CVA Father    Alcohol abuse Father    Gout Brother     Social History Social History   Tobacco Use   Smoking status: Former    Packs/day: 1.00    Types: Cigarettes    Start date: 08/02/1993    Quit date: 06/02/2013    Years since quitting: 9.4   Smokeless tobacco: Current   Tobacco comments:    Vapes for the past 2 years   Vaping Use   Vaping Use: Every day  Substance Use Topics   Alcohol use: Not Currently    Alcohol/week: 0.0 standard drinks of alcohol    Comment: Occasional   Drug use: No     Allergies   Patient has no known allergies.   Review of  Systems Review of Systems  Constitutional:  Negative for chills and fever.  HENT:  Positive for congestion, postnasal drip and sinus pressure. Negative for ear pain and sore throat.   Eyes:  Positive for discharge, redness and itching. Negative for pain and visual disturbance.  Respiratory:  Negative for cough and shortness of breath.   Cardiovascular:  Negative for chest pain and palpitations.  Gastrointestinal:  Positive for diarrhea and nausea. Negative for abdominal pain and vomiting.  Skin:  Negative for rash.  All other systems reviewed and are negative.    Physical Exam Triage Vital Signs ED Triage Vitals  Enc Vitals Group     BP 10/26/22 1556 (!) 153/91     Pulse Rate 10/26/22 1549 90     Resp 10/26/22 1549 18     Temp 10/26/22 1549 97.7 F (36.5 C)     Temp src --      SpO2 10/26/22 1549 96 %     Weight --      Height --      Head Circumference --      Peak Flow --      Pain Score 10/26/22 1552 3     Pain Loc --      Pain Edu? --      Excl. in [place]? --    No data found.  Updated Vital Signs BP (!) 153/91   Pulse 90   Temp 97.7 F (36.5 C)   Resp 18   SpO2 96%   Visual Acuity Right Eye Distance:   Left Eye Distance:   Bilateral Distance:    Right Eye Near:   Left Eye Near:    Bilateral Near:     Physical Exam Vitals and nursing note reviewed.  Constitutional:      General: He is not in acute distress.    Appearance: Normal appearance. He is well-developed. He is not ill-appearing.  HENT:     Right Ear: Tympanic membrane normal.     Left Ear: Tympanic membrane normal.     Nose: Congestion present.     Mouth/Throat:     Pharynx: Oropharynx is clear.  Eyes:     General: Lids are normal. Vision grossly intact.        Right eye: No discharge.        Left eye: No discharge.     Extraocular Movements: Extraocular movements intact.     Conjunctiva/sclera:     Right eye: Right conjunctiva is injected.     Left eye: Left conjunctiva is injected.      Pupils: Pupils are equal, round, and reactive to  light.  Cardiovascular:     Rate and Rhythm: Normal rate and regular rhythm.     Heart sounds: Normal heart sounds.  Pulmonary:     Effort: Pulmonary effort is normal. No respiratory distress.     Breath sounds: Normal breath sounds.  Abdominal:     General: Bowel sounds are normal.     Palpations: Abdomen is soft.     Tenderness: There is no abdominal tenderness. There is no guarding or rebound.  Musculoskeletal:     Cervical back: Neck supple.  Skin:    General: Skin is warm and dry.  Neurological:     Mental Status: He is alert.  Psychiatric:        Mood and Affect: Mood normal.        Behavior: Behavior normal.      UC Treatments / Results  Labs (all labs ordered are listed, but only abnormal results are displayed) Labs Reviewed - No data to display  EKG   Radiology No results found.  Procedures Procedures (including critical care time)  Medications Ordered in UC Medications - No data to display  Initial Impression / Assessment and Plan / UC Course  I have reviewed the triage vital signs and the nursing notes.  Pertinent labs & imaging results that were available during my care of the patient were reviewed by me and considered in my medical decision making (see chart for details).   Left eye conjunctivitis, acute sinusitis, diarrhea.  Treating with Zithromax as patient reports this has worked well for him in the past and has not caused GI upset.  Treating eye symptoms with Polytrim eye drops.  Treating diarrhea with diarrhea diet.  Instructed patient to follow up with his PCP if his symptoms are not improving.  He agrees to plan of care.     Final Clinical Impressions(s) / UC Diagnoses   Final diagnoses:  Acute conjunctivitis of left eye, unspecified acute conjunctivitis type  Acute non-recurrent maxillary sinusitis  Diarrhea, unspecified type     Discharge Instructions      Use the antibiotic eye drops  and take the Zithromax as directed.    Follow the diarrhea diet.    Follow up with your primary care provider if your symptoms are not improving.        ED Prescriptions     Medication Sig Dispense Auth. Provider   azithromycin (ZITHROMAX) 250 MG tablet Take 1 tablet (250 mg total) by mouth daily. Take first 2 tablets together, then 1 every day until finished. 6 tablet Sharion Balloon, NP   trimethoprim-polymyxin b (POLYTRIM) ophthalmic solution Place 1 drop into both eyes 4 (four) times daily for 7 days. 10 mL Sharion Balloon, NP      PDMP not reviewed this encounter.   Sharion Balloon, NP 10/26/22 [phone]

## 2022-11-12 ENCOUNTER — Ambulatory Visit
Admission: RE | Admit: 2022-11-12 | Discharge: 2022-11-12 | Disposition: A | Discharge status: Home / Self Care | Payer: BC Managed Care – PPO | Source: Ambulatory Visit | Attending: Urgent Care | Admitting: Urgent Care

## 2022-11-12 VITALS — BP 137/89 | HR 81 | Temp 98.3°F | Resp 18

## 2022-11-12 DIAGNOSIS — J0101 Acute recurrent maxillary sinusitis: Secondary | ICD-10-CM

## 2022-11-12 DIAGNOSIS — H1033 Unspecified acute conjunctivitis, bilateral: Secondary | ICD-10-CM

## 2022-11-12 MED ORDER — PREDNISONE 20 MG PO TABS: 60.0000 mg | ORAL_TABLET | Freq: Every day | ORAL | 0 refills | Status: AC

## 2022-11-12 MED ORDER — CIPROFLOXACIN HCL 0.3 % OP SOLN: OPHTHALMIC | 0 refills | Status: DC

## 2022-11-12 NOTE — Discharge Instructions (Signed)
Follow up here or with your primary care provider if your symptoms are worsening or not improving.     

## 2022-11-12 NOTE — ED Triage Notes (Signed)
Patient presents to UC for continued eye swelling and drainage. Seen 01/26. Treated with z-pak.

## 2022-11-12 NOTE — ED Provider Notes (Signed)
Gary Mccullough    CSN: [id number] Arrival date & time: 11/12/22  1447      History   Chief Complaint Chief Complaint  Patient presents with   Eye Problem    Continued eye swelling & draining since zpak completed. - Entered by patient    HPI Gary Mccullough is a 52 y.o. male.   HPI  Presents to urgent care with complaint of bilateral eye swelling, clear drainage, redness.  He continues to complain of sinus pressure, on the left side behind his eyes.  Patient has been seen in urgent care twice starting 10/20/2022 when he presented with sinus symptoms including pain/pressure behind his eyes, sensitivity to light, headache, nausea, sore throat accompanied by fever of 102 F.  He stated that he had been working in a dusty environment and symptoms x 1 week.  He was given Augmentin for possible bacterial involvement along with a course of prednisone (taper) over 6 days.  He reported stomach upset with Augmentin.  He was seen again on 10/26/2022 with left eye discomfort, drainage, crusting in lashes, redness x 1 day.  He complained of ongoing sinus congestion, postnasal drip, pressure.  At this visit he was given azithromycin and Polytrim.  He reports some improvement after azithromycin.  Past Medical History:  Diagnosis Date   Alcohol abuse    Last Heavy Use 2014   Allergy    Cervical stenosis of spine    Chicken pox    Fatty liver    Liver hemangioma    Seizures ([place])    Spondylisthesis     Patient Active Problem List   Diagnosis Date Noted   Respiratory infection 03/06/2022   Colon cancer screening 11/21/2020   Encounter for preventive health examination 11/15/2018   Bleeding hemorrhoid 08/12/2016   Spondylolysis of lumbar region 08/12/2016   Benign liver cyst 08/12/2016   Renal cyst, acquired, left 08/12/2016   Hemangioma of liver 08/10/2016   History of drug withdrawal syndrome 05/12/2016   Irritable bowel syndrome (IBS) 01/27/2016   History of tobacco abuse  08/06/2015   Allergic rhinitis 06/10/2014   Cervical spinal stenosis 06/10/2014   Acid reflux 06/10/2014   Fatty infiltration of liver 06/10/2014    Past Surgical History:  Procedure Laterality Date   FINGER SURGERY  1980's   [place]  2013   Dr, [place] Medications    Prior to Admission medications   Medication Sig Start Date End Date Taking? Authorizing Provider  aspirin 325 MG tablet Take 325 mg by mouth every 6 (six) hours as needed.     [provider]  azithromycin (ZITHROMAX) 250 MG tablet Take 1 tablet (250 mg total) by mouth daily. Take first 2 tablets together, then 1 every day until finished. 10/26/22   Sharion Balloon, NP  cetirizine (ZYRTEC) 10 MG tablet Take 10 mg by mouth daily as needed.     [provider]  famotidine (PEPCID) 10 MG tablet Take 10 mg by mouth 2 (two) times daily.    [provider]  fluticasone (FLONASE) 50 MCG/ACT nasal spray Place into both nostrils daily. AS NEEDED    [provider]  Glucosamine-Chondroitin (GLUCOSAMINE CHONDR COMPLEX PO) Take 1 capsule by mouth daily.    [provider]  Multiple Vitamin (MULTI-VITAMINS) TABS Take by mouth.    [provider]  Omega-3 Fatty Acids (FISH OIL) 1000 MG CAPS Take by mouth.    [provider]  TURMERIC PO Take 2 capsules by mouth daily.    [provider]    Family History Family History  Problem Relation Age of Onset   CVA Father    Alcohol abuse Father    Gout Brother     Social History Social History   Tobacco Use   Smoking status: Former    Packs/day: 1.00    Types: Cigarettes    Start date: 08/02/1993    Quit date: 06/02/2013    Years since quitting: 9.4   Smokeless tobacco: Current   Tobacco comments:    Vapes for the past 2 years   Vaping Use   Vaping Use: Every day  Substance Use Topics   Alcohol use: Not  Currently    Alcohol/week: 0.0 standard drinks of alcohol    Comment: Occasional   Drug use: No     Allergies   Patient has no known allergies.   Review of Systems Review of Systems   Physical Exam Triage Vital Signs ED Triage Vitals [11/12/22 1511]  Enc Vitals Group     BP 137/89     Pulse Rate 81     Resp 18     Temp 98.3 F (36.8 C)     Temp Source Oral     SpO2 98 %     Weight      Height      Head Circumference      Peak Flow      Pain Score      Pain Loc      Pain Edu?      Excl. in [place]?    No data found.  Updated Vital Signs BP 137/89 (BP Location: Left Arm)   Pulse 81   Temp 98.3 F (36.8 C) (Oral)   Resp 18   SpO2 98%   Visual Acuity Right Eye Distance:   Left Eye Distance:   Bilateral Distance:    Right Eye Near:   Left Eye Near:    Bilateral Near:     Physical Exam   UC Treatments / Results  Labs (all labs ordered are listed, but only abnormal results are displayed) Labs Reviewed - No data to display  EKG   Radiology No results found.  Procedures Procedures (including critical care time)  Medications Ordered in UC Medications - No data to display  Initial Impression / Assessment and Plan / UC Course  I have reviewed the triage vital signs and the nursing notes.  Pertinent labs & imaging results that were available during my care of the patient were reviewed by me and considered in my medical decision making (see chart for details).   Patient is afebrile here without recent antipyretics. Satting well on room air. Overall is well appearing, well hydrated, without respiratory distress.  Redness and puffiness around eyes and lids.  Clear discharge.  Unclear etiology for his symptoms.  I suspect his initial symptoms were viral and Augmentin was understandably ineffective.  His current symptoms seem allergic versus viral to me.  He reports early morning crusting and then clear discharge from his eyes throughout the day.  He  endorses nasal drainage that is clear accompanying the left-sided sinus pressure.  He has an appointment with an eye doctor on Wednesday.  Will switch from Polytrim to ciprofloxacin eyedrops in advance of that appointment to cover possible bacterial involvement, though  I think this will continue to be ineffective as his symptoms are suspected to be viral versus allergic.  Will also provide another course of prednisone as it appears he was not taking the previously prescribed medication as directed.  He insists it was completed in 3 days rather than the 6 days that was instructed in the prescription.   Final Clinical Impressions(s) / UC Diagnoses   Final diagnoses:  None   Discharge Instructions   None    ED Prescriptions   None    PDMP not reviewed this encounter.   Rose Phi, [place] 11/12/22 [phone]

## 2022-11-14 DIAGNOSIS — H0288B Meibomian gland dysfunction left eye, upper and lower eyelids: Secondary | ICD-10-CM | POA: Diagnosis not present

## 2022-11-14 DIAGNOSIS — H01021 Squamous blepharitis right upper eyelid: Secondary | ICD-10-CM | POA: Diagnosis not present

## 2022-11-14 DIAGNOSIS — H01024 Squamous blepharitis left upper eyelid: Secondary | ICD-10-CM | POA: Diagnosis not present

## 2022-11-14 DIAGNOSIS — H0288A Meibomian gland dysfunction right eye, upper and lower eyelids: Secondary | ICD-10-CM | POA: Diagnosis not present

## 2022-11-23 ENCOUNTER — Encounter: Payer: Self-pay | Admitting: Internal Medicine

## 2022-11-23 ENCOUNTER — Ambulatory Visit (INDEPENDENT_AMBULATORY_CARE_PROVIDER_SITE_OTHER): Payer: BC Managed Care – PPO | Admitting: Internal Medicine

## 2022-11-23 VITALS — BP 126/68 | HR 98 | Temp 98.0°F | Ht 75.0 in | Wt 200.8 lb

## 2022-11-23 DIAGNOSIS — Z0001 Encounter for general adult medical examination with abnormal findings: Secondary | ICD-10-CM | POA: Diagnosis not present

## 2022-11-23 DIAGNOSIS — R7301 Impaired fasting glucose: Secondary | ICD-10-CM | POA: Diagnosis not present

## 2022-11-23 DIAGNOSIS — R5383 Other fatigue: Secondary | ICD-10-CM | POA: Diagnosis not present

## 2022-11-23 DIAGNOSIS — J328 Other chronic sinusitis: Secondary | ICD-10-CM | POA: Diagnosis not present

## 2022-11-23 DIAGNOSIS — Z125 Encounter for screening for malignant neoplasm of prostate: Secondary | ICD-10-CM

## 2022-11-23 DIAGNOSIS — E785 Hyperlipidemia, unspecified: Secondary | ICD-10-CM

## 2022-11-23 DIAGNOSIS — H10403 Unspecified chronic conjunctivitis, bilateral: Secondary | ICD-10-CM

## 2022-11-23 LAB — COMPREHENSIVE METABOLIC PANEL
ALT: 79 U/L — ABNORMAL HIGH (ref 0–53)
AST: 31 U/L (ref 0–37)
Albumin: 4.7 g/dL (ref 3.5–5.2)
Alkaline Phosphatase: 61 U/L (ref 39–117)
BUN: 13 mg/dL (ref 6–23)
CO2: 29 mEq/L (ref 19–32)
Calcium: 10.1 mg/dL (ref 8.4–10.5)
Chloride: 102 mEq/L (ref 96–112)
Creatinine, Ser: 0.83 mg/dL (ref 0.40–1.50)
GFR: 102.22 mL/min (ref >60.00)
Glucose, Bld: 88 mg/dL (ref 70–99)
Potassium: 4.5 mEq/L (ref 3.5–5.1)
Sodium: 140 mEq/L (ref 135–145)
Total Bilirubin: 0.5 mg/dL (ref 0.2–1.2)
Total Protein: 7.1 g/dL (ref 6.0–8.3)

## 2022-11-23 LAB — LIPID PANEL
Cholesterol: 162 mg/dL (ref 0–200)
HDL: 54.7 mg/dL (ref >39.00)
LDL Cholesterol: 78 mg/dL (ref 0–99)
NonHDL: 107.18
Total CHOL/HDL Ratio: 3
Triglycerides: 147 mg/dL (ref 0.0–149.0)
VLDL: 29.4 mg/dL (ref 0.0–40.0)

## 2022-11-23 LAB — CBC WITH DIFFERENTIAL/PLATELET
Basophils Absolute: 0 10*3/uL (ref 0.0–0.1)
Basophils Relative: 0.7 % (ref 0.0–3.0)
Eosinophils Absolute: 0.2 10*3/uL (ref 0.0–0.7)
Eosinophils Relative: 3.2 % (ref 0.0–5.0)
HCT: 40 % (ref 39.0–52.0)
Hemoglobin: 13.8 g/dL (ref 13.0–17.0)
Lymphocytes Relative: 47.9 % — ABNORMAL HIGH (ref 12.0–46.0)
Lymphs Abs: 2.6 10*3/uL (ref 0.7–4.0)
MCHC: 34.4 g/dL (ref 30.0–36.0)
MCV: 93.3 fl (ref 78.0–100.0)
Monocytes Absolute: 0.5 10*3/uL (ref 0.1–1.0)
Monocytes Relative: 9.4 % (ref 3.0–12.0)
Neutro Abs: 2.1 10*3/uL (ref 1.4–7.7)
Neutrophils Relative %: 38.8 % — ABNORMAL LOW (ref 43.0–77.0)
Platelets: 356 10*3/uL (ref 150.0–400.0)
RBC: 4.29 Mil/uL (ref 4.22–5.81)
RDW: 12.4 % (ref 11.5–15.5)
WBC: 5.4 10*3/uL (ref 4.0–10.5)

## 2022-11-23 LAB — PSA: PSA: 0.32 ng/mL (ref 0.10–4.00)

## 2022-11-23 LAB — LDL CHOLESTEROL, DIRECT: Direct LDL: 87 mg/dL

## 2022-11-23 LAB — TSH: TSH: 1.02 u[IU]/mL (ref 0.35–5.50)

## 2022-11-23 MED ORDER — AZITHROMYCIN 500 MG PO TABS: 500.0000 mg | ORAL_TABLET | Freq: Every day | ORAL | 0 refills | Status: DC

## 2022-11-23 MED ORDER — OLOPATADINE HCL 0.1 % OP SOLN: 1.0000 [drp] | Freq: Two times a day (BID) | OPHTHALMIC | 12 refills | Status: DC

## 2022-11-23 MED ORDER — FLUTICASONE PROPIONATE 50 MCG/ACT NA SUSP: 2.0000 | Freq: Every day | NASAL | 5 refills | Status: AC

## 2022-11-23 NOTE — Progress Notes (Unsigned)
Patient ID: Gary Mccullough, male    DOB: 09-05-1971  Age: 52 y.o. MRN: [id number]  The patient is here for annual preventive examination and management of other chronic and acute problems.   The risk factors are reflected in the social history.   The roster of all physicians providing medical care to patient - is listed in the Snapshot section of the chart.   Activities of daily living:  The patient is 100% independent in all ADLs: dressing, toileting, feeding as well as independent mobility   Home safety : The patient has smoke detectors in the home. They wear seatbelts.  There are no unsecured firearms at home. There is no violence in the home.    There is no risks for hepatitis, STDs or HIV. There is no   history of blood transfusion. They have no travel history to infectious disease endemic areas of the world.   The patient has seen their dentist in the last six month. They have seen their eye doctor in the last year. The patinet  denies slight hearing difficulty with regard to whispered voices and some television programs.  They have deferred audiologic testing in the last year.  They do not  have excessive sun exposure. Discussed the need for sun protection: hats, long sleeves and use of sunscreen if there is significant sun exposure.    Diet: the importance of a healthy diet is discussed. They do have a healthy diet.   The benefits of regular aerobic exercise were discussed. The patient  exercises  3 to 5 days per week  for  60 minutes.    Depression screen: there are no signs or vegative symptoms of depression- irritability, change in appetite, anhedonia, sadness/tearfullness.   The following portions of the patient's history were reviewed and updated as appropriate: allergies, current medications, past family history, past medical history,  past surgical history, past social history  and problem list.   Visual acuity was not assessed per patient preference since the patient has  regular follow up with an  ophthalmologist. Hearing and body mass index were assessed and reviewed.    During the course of the visit the patient was educated and counseled about appropriate screening and preventive services including : fall prevention , diabetes screening, nutrition counseling, colorectal cancer screening, and recommended immunizations.    Chief Complaint:  1) He reports that he has had a sinus infection for 6 weeks,  and headache for 4 weeks  no fevers since week 1 ., has occasional blood streaks in mucus from left side  Treated 3 times by Urgent Care for conjunctivitis/sinusitis followed by an appointment with an eye doctor 2 days ago.  Last UC M gave him  ciprofloxacin eyedrops in advance of that appointment to cover possible bacterial involvement, though he opined that it was likely to also be ineffective as his symptoms are suspected to be viral versus allergic. He was also given  another course of prednisone as it appears he was not taking the previously prescribed medication as directed.    Eye doctor diagnosed blepharitis and was prescribed tobramycin-dexamethasone eye drops and is on day 3 .  Taking flonase and zyrtec,  but doesn't use them on same day because eyes are "too dry"       Review of Symptoms  Patient denies headache, fevers, malaise, unintentional weight loss, skin rash, eye pain, sinus congestion and sinus pain, sore throat, dysphagia,  hemoptysis , cough, dyspnea, wheezing, chest pain, palpitations, orthopnea, edema, abdominal  pain, nausea, melena, diarrhea, constipation, flank pain, dysuria, hematuria, urinary  Frequency, nocturia, numbness, tingling, seizures,  Focal weakness, Loss of consciousness,  Tremor, insomnia, depression, anxiety, and suicidal ideation.    Physical Exam:  BP 126/68   Pulse 98   Temp 98 F (36.7 C) (Oral)   Ht '6\' 3"'$  (1.905 m)   Wt 200 lb 12.8 oz (91.1 kg)   SpO2 98%   BMI 25.10 kg/m    Physical Exam Vitals reviewed.   Constitutional:      General: He is not in acute distress.    Appearance: Normal appearance. He is normal weight. He is not ill-appearing, toxic-appearing or diaphoretic.  HENT:     Head: Normocephalic.  Eyes:     General: No scleral icterus.       Right eye: No discharge.        Left eye: No discharge.     Conjunctiva/sclera: Conjunctivae normal.  Cardiovascular:     Rate and Rhythm: Normal rate and regular rhythm.     Heart sounds: Normal heart sounds.  Pulmonary:     Effort: Pulmonary effort is normal. No respiratory distress.     Breath sounds: Normal breath sounds.  Musculoskeletal:        General: Normal range of motion.     Cervical back: Normal range of motion.  Skin:    General: Skin is warm and dry.  Neurological:     General: No focal deficit present.     Mental Status: He is alert and oriented to person, place, and time. Mental status is at baseline.  Psychiatric:        Mood and Affect: Mood normal.        Behavior: Behavior normal.        Thought Content: Thought content normal.        Judgment: Judgment normal.     Assessment and Plan: Prostate cancer screening -     PSA  Fatigue, unspecified type -     CBC with Differential/Platelet -     TSH  Impaired fasting glucose -     Comprehensive metabolic panel -     Hemoglobin A1c  Hyperlipidemia, unspecified hyperlipidemia type -     Lipid panel -     LDL cholesterol, direct    No follow-ups on file.  [name], MD

## 2022-11-23 NOTE — Patient Instructions (Addendum)
I AM PRESCRIBING ONCE DAILY Azithromycin  for  14  days  Daily use of a probiotic IS advised for 4  weeks.    Continue flonase and add patanol ( antihistamine for the eyes)  to your current eye regimen   If no resolution of headache/sinusitis call for ENT referral

## 2022-11-23 NOTE — Assessment & Plan Note (Signed)

## 2022-11-23 NOTE — Assessment & Plan Note (Signed)
Left side  augmentin intolerant due to diarrhea.  Azithromycin x 14 days

## 2022-11-25 DIAGNOSIS — H109 Unspecified conjunctivitis: Secondary | ICD-10-CM | POA: Insufficient documentation

## 2022-11-25 NOTE — Assessment & Plan Note (Signed)
Adding patanol for allergic etiology

## 2022-11-28 DIAGNOSIS — H1045 Other chronic allergic conjunctivitis: Secondary | ICD-10-CM | POA: Diagnosis not present

## 2022-11-28 DIAGNOSIS — H01021 Squamous blepharitis right upper eyelid: Secondary | ICD-10-CM | POA: Diagnosis not present

## 2022-11-28 DIAGNOSIS — H0288A Meibomian gland dysfunction right eye, upper and lower eyelids: Secondary | ICD-10-CM | POA: Diagnosis not present

## 2022-11-28 DIAGNOSIS — H01024 Squamous blepharitis left upper eyelid: Secondary | ICD-10-CM | POA: Diagnosis not present

## 2023-05-30 IMAGING — DX DG CHEST 2V
2 series · 2 of 2 positions shown · non-contrast
Comparison: Prior chest x-ray report 04/25/1997.

CLINICAL DATA: Fevers.  Productive cough.

EXAM:
CHEST - 2 VIEW

[chest pa]
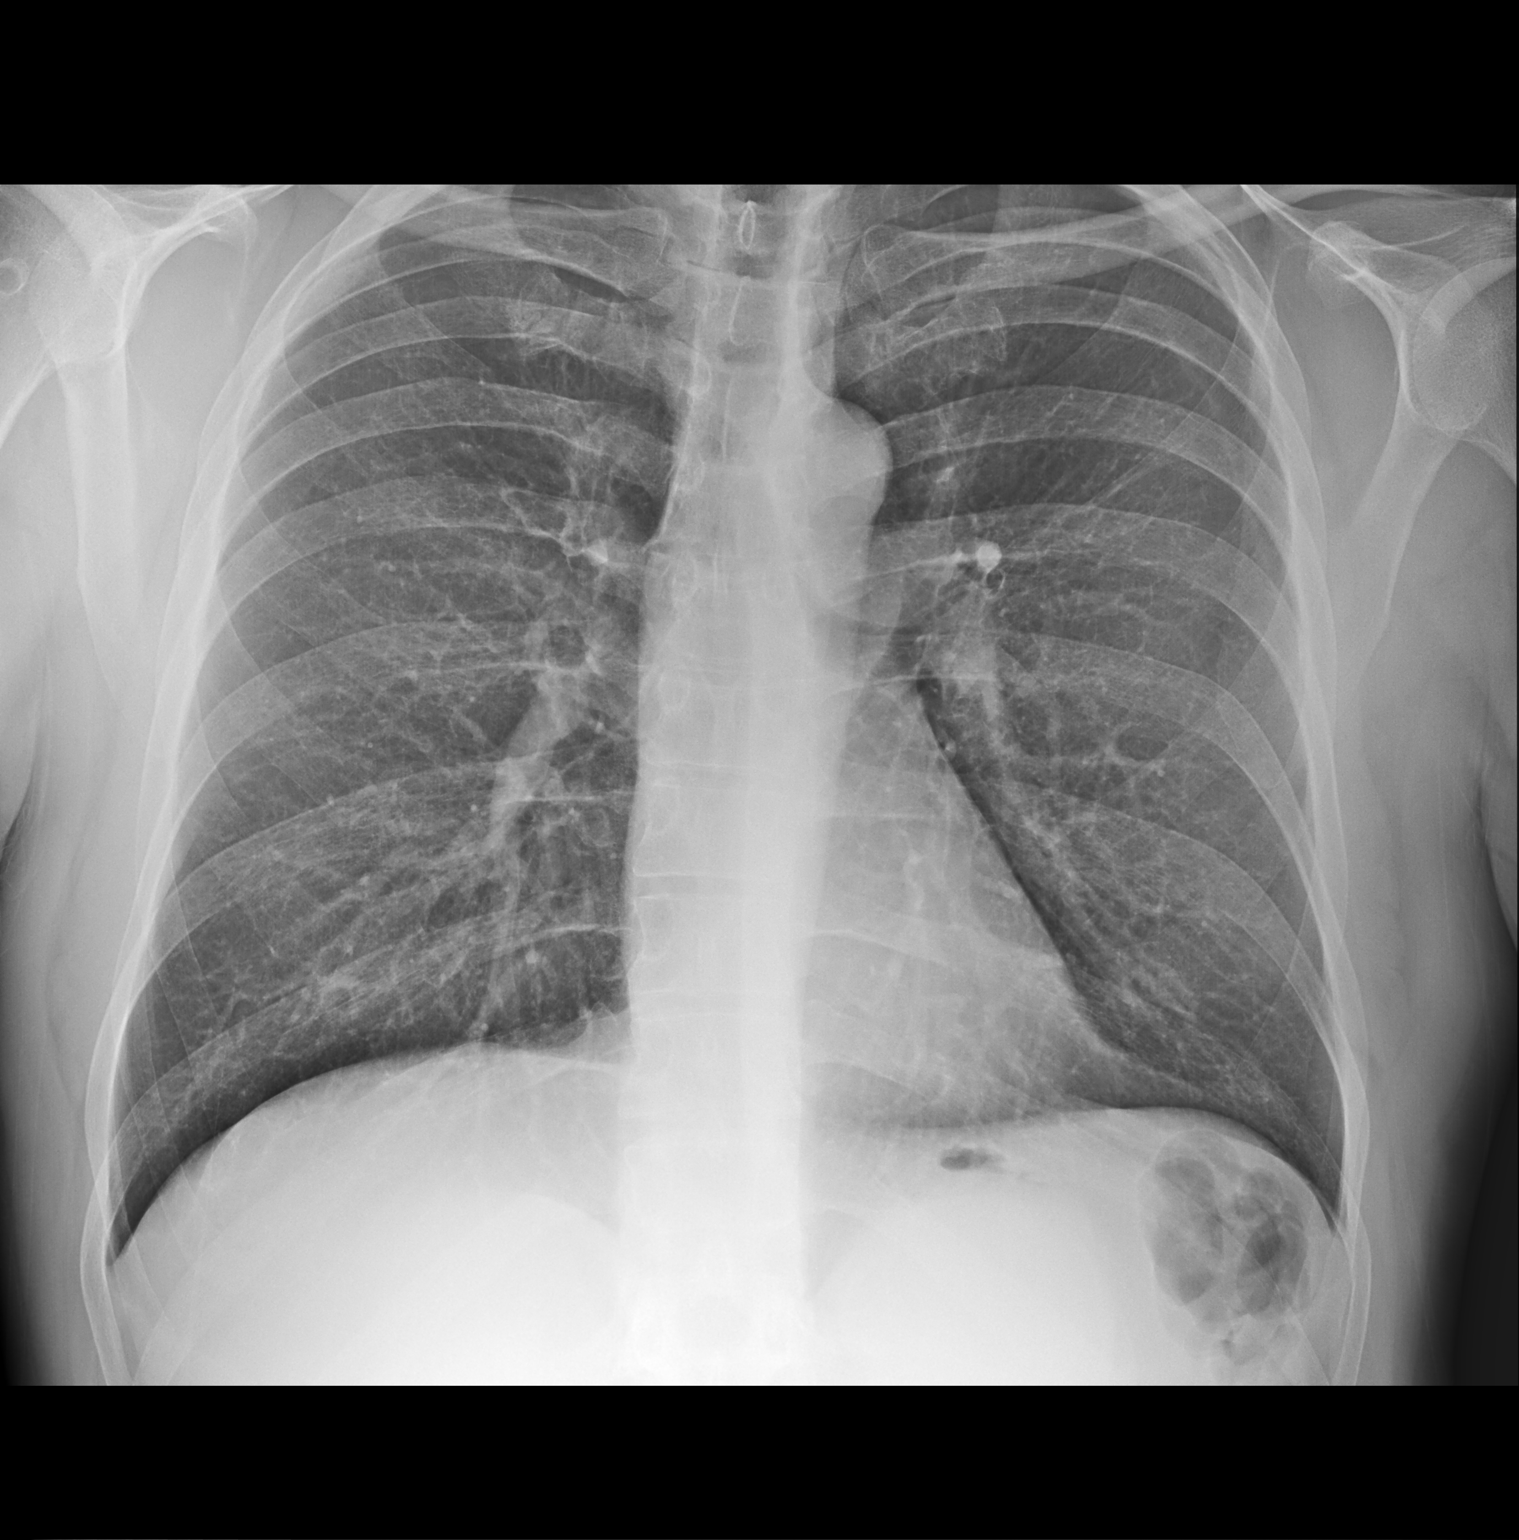

[chest lat]
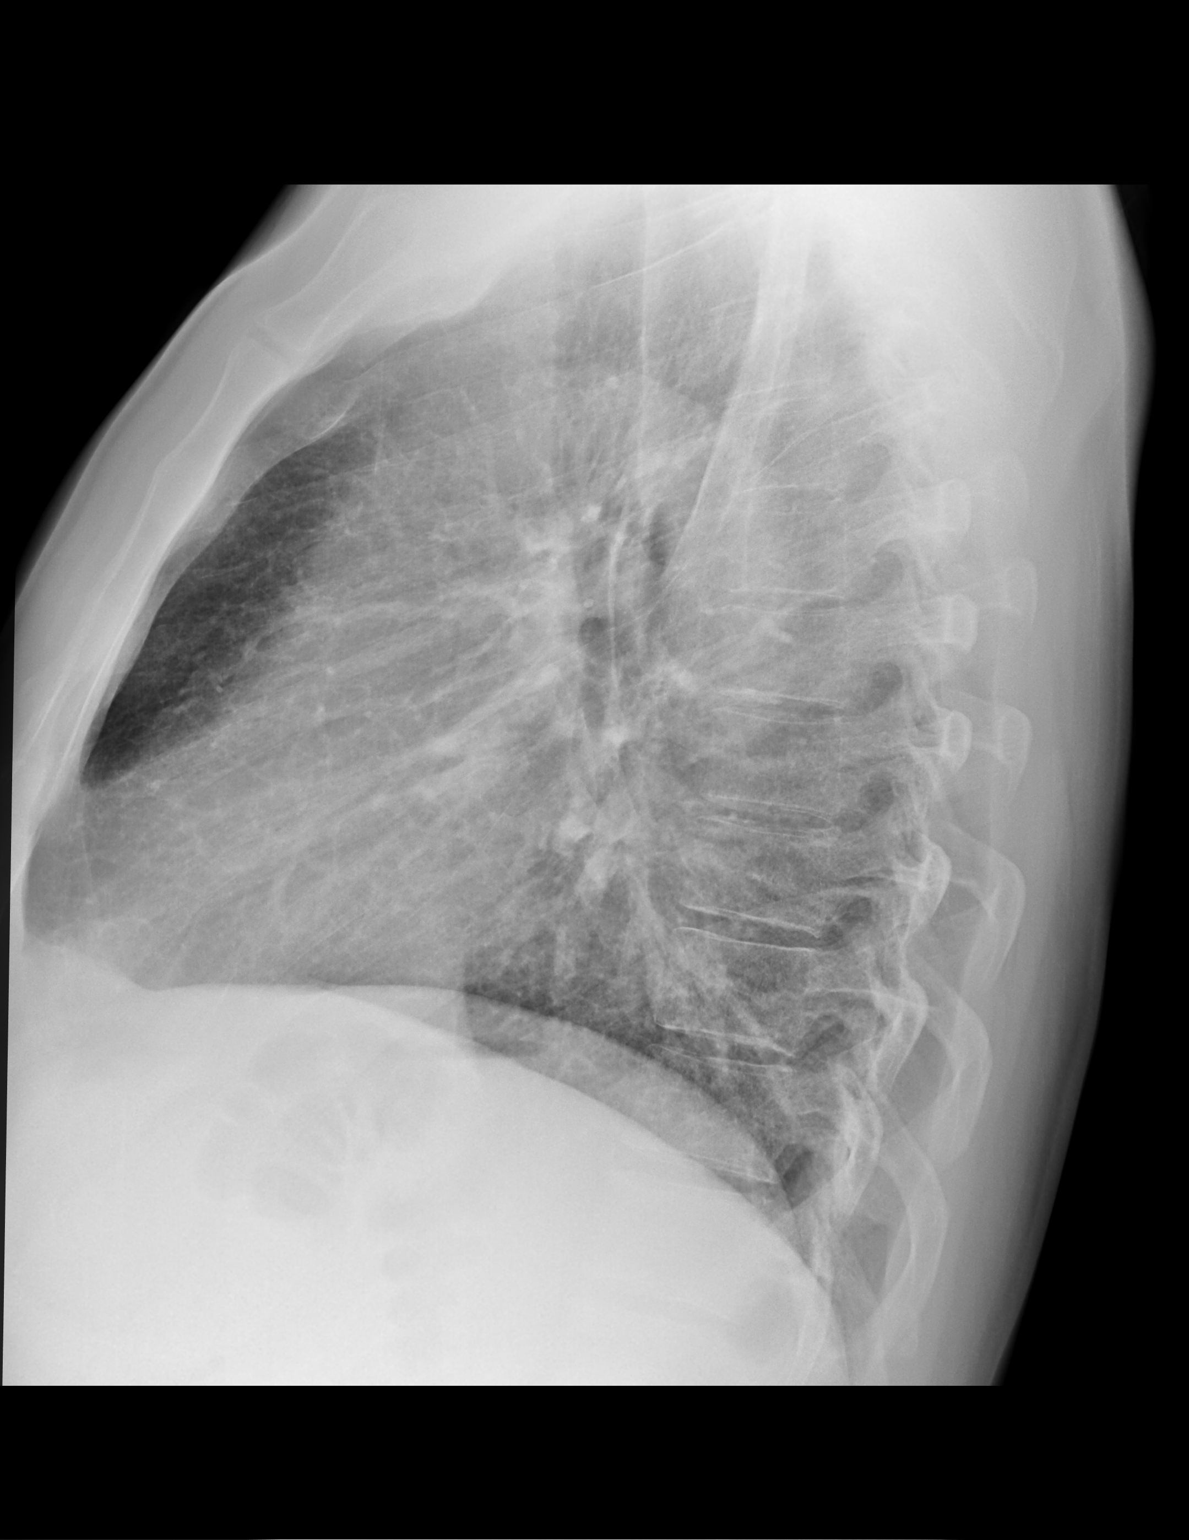

[2 of 2 positions shown; findings below may reference images not displayed]

FINDINGS: Mediastinum and hilar structures normal. Lungs are clear. No pleural
effusion or pneumothorax. Mild biapical pleuroparenchymal thickening
consistent scarring. Heart size normal. Degenerative changes
scoliosis thoracic spine.
IMPRESSION: No acute cardiopulmonary disease.

## 2023-09-07 ENCOUNTER — Ambulatory Visit
Admission: EM | Admit: 2023-09-07 | Discharge: 2023-09-07 | Disposition: A | Discharge status: Home / Self Care | Payer: BC Managed Care – PPO | Attending: Emergency Medicine | Admitting: Emergency Medicine

## 2023-09-07 ENCOUNTER — Encounter: Payer: Self-pay | Admitting: Emergency Medicine

## 2023-09-07 DIAGNOSIS — J069 Acute upper respiratory infection, unspecified: Secondary | ICD-10-CM | POA: Diagnosis not present

## 2023-09-07 MED ORDER — PROMETHAZINE-DM 6.25-15 MG/5ML PO SYRP: 5.0000 mL | ORAL_SOLUTION | Freq: Every evening | ORAL | 0 refills | Status: DC | PRN

## 2023-09-07 MED ORDER — BENZONATATE 100 MG PO CAPS: 100.0000 mg | ORAL_CAPSULE | Freq: Three times a day (TID) | ORAL | 0 refills | Status: DC

## 2023-09-07 MED ORDER — LIDOCAINE VISCOUS HCL 2 % MT SOLN: 15.0000 mL | OROMUCOSAL | 0 refills | Status: DC | PRN

## 2023-09-07 NOTE — ED Triage Notes (Signed)
Pt presents with a sore throat and fever x 2 days.  

## 2023-09-07 NOTE — Discharge Instructions (Addendum)
Your symptoms today are most likely being caused by a virus and should steadily improve in time it can take up to 7 to 10 days before you truly start to see a turnaround however things will get better  May use Tessalon every 8 hours as needed to help with cough, may use cough syrup at bedtime for additional coverage  May gargle and spit lidocaine solution every 4 hours to provide temporary relief to your throat    You can take Tylenol and/or Ibuprofen as needed for fever reduction and pain relief.   For cough: honey 1/2 to 1 teaspoon (you can dilute the honey in water or another fluid).  You can also use guaifenesin and dextromethorphan for cough. You can use a humidifier for chest congestion and cough.  If you don't have a humidifier, you can sit in the bathroom with the hot shower running.      For sore throat: try warm salt water gargles, cepacol lozenges, throat spray, warm tea or water with lemon/honey, popsicles or ice, or OTC cold relief medicine for throat discomfort.   For congestion: take a daily anti-histamine like Zyrtec, Claritin, and a oral decongestant, such as pseudoephedrine.  You can also use Flonase 1-2 sprays in each nostril daily.   It is important to stay hydrated: drink plenty of fluids (water, gatorade/powerade/pedialyte, juices, or teas) to keep your throat moisturized and help further relieve irritation/discomfort.

## 2023-09-08 NOTE — ED Provider Notes (Signed)
Gary Mccullough    CSN: [id number] Arrival date & time: 09/07/23  1412      History   Chief Complaint Chief Complaint  Patient presents with   Sore Throat   Fever    HPI Gary Mccullough is a 52 y.o. male.   Presents for evaluation of fever peaking at 99.4, nasal congestion, nonproductive cough, sore throat and intermittent headaches present for 2 days.  Feels as if there is mucus sitting inside the throat that he is having difficulty moving.  Tolerating food and liquids.  Has attempted use of Mucinex.  No known sick contacts prior.  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   Colon cancer screening 11/21/2020   Encounter for general adult medical examination with abnormal findings 11/15/2018   Bleeding hemorrhoid 08/12/2016   Spondylolysis of lumbar region 08/12/2016   Benign liver cyst 08/12/2016   Renal cyst, acquired, left 08/12/2016   Hemangioma of liver 08/10/2016   History of drug withdrawal syndrome 05/12/2016   Irritable bowel syndrome (IBS) 01/27/2016   History of tobacco abuse 08/06/2015   Allergic rhinitis 06/10/2014   Cervical spinal stenosis 06/10/2014   Acid reflux 06/10/2014   Fatty infiltration of liver 06/10/2014    Past Surgical History:  Procedure Laterality Date   FINGER SURGERY  1980's   Thumb    FRACTURE SURGERY  1990   Nose   HERNIA REPAIR     SH[facility]  2013   [facility]       Home Medications    Prior to Admission medications   Medication Sig Start Date End Date Taking? Authorizing Provider  benzonatate (TESSALON) 100 MG capsule Take 1 capsule (100 mg total) by mouth every 8 (eight) hours.  09/07/23  Yes Eurika Sandy R, NP  lidocaine (XYLOCAINE) 2 % solution Use as directed 15 mLs in the mouth or throat as needed. 09/07/23  Yes Britney Captain R, NP  promethazine-dextromethorphan (PROMETHAZINE-DM) 6.25-15 MG/5ML syrup Take 5 mLs by mouth at bedtime as needed for cough. 09/07/23  Yes Madline Oesterling, Hansel Starling R, NP  aspirin 325 MG tablet Take 325 mg by mouth every 6 (six) hours as needed.     [provider]  azithromycin (ZITHROMAX) 500 MG tablet Take 1 tablet (500 mg total) by mouth daily. 11/23/22   Sherlene Shams, MD  cetirizine (ZYRTEC) 10 MG tablet Take 10 mg by mouth daily as needed.     [provider]  famotidine (PEPCID) 10 MG tablet Take 10 mg by mouth 2 (two) times daily.    [provider]  fluticasone (FLONASE) 50 MCG/ACT nasal spray Place into both nostrils daily. AS NEEDED    [provider]  fluticasone (FLONASE) 50 MCG/ACT nasal spray Place 2 sprays into both nostrils daily. 11/23/22   Sherlene Shams, MD  Glucosamine-Chondroitin (GLUCOSAMINE CHONDR COMPLEX PO) Take 1 capsule by mouth daily.    [provider]  Multiple Vitamin (MULTI-VITAMINS) TABS Take by mouth.    [provider]  olopatadine (PATANOL) 0.1 % ophthalmic solution Place 1 drop into both eyes 2 (two) times  daily. 11/23/22   Sherlene Shams, MD  Omega-3 Fatty Acids (FISH OIL) 1000 MG CAPS Take by mouth.    [provider]  tobramycin-dexamethasone Wallene Dales) ophthalmic solution SMARTSIG:In Eye(s) 11/14/22   [provider]  TURMERIC PO Take 2 capsules by mouth daily.    [provider]    Family History Family History  Problem Relation Age of Onset   CVA Father    Alcohol abuse Father    Gout Brother     Social History Social History   Tobacco Use   Smoking status: Former    Current packs/day: 0.00    Average packs/day: 1 pack/day for 19.8 years (19.8 ttl pk-yrs)    Types: Cigarettes    Start date: 08/02/1993    Quit  date: 06/02/2013    Years since quitting: 10.2   Smokeless tobacco: Current   Tobacco comments:    Vapes for the past 2 years   Vaping Use   Vaping status: Every Day  Substance Use Topics   Alcohol use: Not Currently    Alcohol/week: 0.0 standard drinks of alcohol    Comment: Occasional   Drug use: No     Allergies   Patient has no known allergies.   Review of Systems Review of Systems  Constitutional:  Positive for fever.     Physical Exam Triage Vital Signs ED Triage Vitals  Encounter Vitals Group     BP 09/07/23 1447 (!) 159/93     Systolic BP Percentile --      Diastolic BP Percentile --      Pulse Rate 09/07/23 1447 88     Resp 09/07/23 1447 16     Temp 09/07/23 1447 98.8 F (37.1 C)     Temp Source 09/07/23 1447 Oral     SpO2 09/07/23 1447 98 %     Weight --      Height --      Head Circumference --      Peak Flow --      Pain Score 09/07/23 1446 5     Pain Loc --      Pain Education --      Exclude from Growth Chart --    No data found.  Updated Vital Signs BP (!) 159/93 (BP Location: Left Arm)   Pulse 88   Temp 98.8 F (37.1 C) (Oral)   Resp 16   SpO2 98%   Visual Acuity Right Eye Distance:   Left Eye Distance:   Bilateral Distance:    Right Eye Near:   Left Eye Near:    Bilateral Near:     Physical Exam Constitutional:      Appearance: Normal appearance.  HENT:     Head: Normocephalic.     Right Ear: Tympanic membrane, ear canal and external ear normal.     Left Ear: Tympanic membrane, ear canal and external ear normal.     Nose: Congestion present. No rhinorrhea.     Mouth/Throat:     Mouth: Mucous membranes are moist.     Pharynx: Oropharynx is clear. Posterior oropharyngeal erythema present. No oropharyngeal exudate.  Eyes:     Extraocular Movements: Extraocular movements intact.  Cardiovascular:     Rate and Rhythm: Normal rate and regular rhythm.     Pulses: Normal pulses.     Heart sounds: Normal heart sounds.  Pulmonary:      Effort: Pulmonary effort is normal.     Breath sounds: Normal breath sounds.  Musculoskeletal:     Cervical back: Normal range of motion and neck supple.  Neurological:     Mental Status: He is alert and oriented to person, place, and time. Mental status is at baseline.      UC Treatments / Results  Labs (all labs ordered are listed, but only abnormal results are displayed) Labs Reviewed  POCT RAPID STREP A (OFFICE)    EKG   Radiology No results found.  Procedures Procedures (including critical care time)  Medications Ordered in UC Medications - No data to display  Initial Impression / Assessment and Plan / UC Course  I have reviewed the triage vital signs and the nursing notes.  Pertinent labs & imaging results that were available during my care of the patient were reviewed by me and considered in my medical decision making (see chart for details).  Viral URI with cough  Patient is in no signs of distress nor toxic appearing.  Vital signs are stable.  Low suspicion for pneumonia, pneumothorax or bronchitis and therefore will defer imaging.  Prescribed Tessalon, promethazine and viscous lidocaine.May use additional over-the-counter medications as needed for supportive care.  May follow-up with urgent care as needed if symptoms persist or worsen.   Final Clinical Impressions(s) / UC Diagnoses   Final diagnoses:  Viral URI with cough     Discharge Instructions      Your symptoms today are most likely being caused by a virus and should steadily improve in time it can take up to 7 to 10 days before you truly start to see a turnaround however things will get better  May use Tessalon every 8 hours as needed to help with cough, may use cough syrup at bedtime for additional coverage  May gargle and spit lidocaine solution every 4 hours to provide temporary relief to your throat    You can take Tylenol and/or Ibuprofen as needed for fever reduction and pain  relief.   For cough: honey 1/2 to 1 teaspoon (you can dilute the honey in water or another fluid).  You can also use guaifenesin and dextromethorphan for cough. You can use a humidifier for chest congestion and cough.  If you don't have a humidifier, you can sit in the bathroom with the hot shower running.      For sore throat: try warm salt water gargles, cepacol lozenges, throat spray, warm tea or water with lemon/honey, popsicles or ice, or OTC cold relief medicine for throat discomfort.   For congestion: take a daily anti-histamine like Zyrtec, Claritin, and a oral decongestant, such as pseudoephedrine.  You can also use Flonase 1-2 sprays in each nostril daily.   It is important to stay hydrated: drink plenty of fluids (water, gatorade/powerade/pedialyte, juices, or teas) to keep your throat moisturized and help further relieve irritation/discomfort.    ED Prescriptions     Medication Sig Dispense Auth. Provider   benzonatate (TESSALON) 100 MG capsule Take 1 capsule (100 mg total) by mouth every 8 (eight) hours. 21 capsule Greogry Goodwyn R, NP   promethazine-dextromethorphan (PROMETHAZINE-DM) 6.25-15 MG/5ML syrup Take 5 mLs by mouth at bedtime as needed for cough. 118 mL Rudine Rieger R, NP   lidocaine (XYLOCAINE) 2 % solution Use as directed 15 mLs in the mouth or throat as needed. 100 mL Valinda Hoar, NP      PDMP not reviewed this encounter.   Valinda Hoar, [place] 09/08/23 [phone]

## 2023-09-10 ENCOUNTER — Ambulatory Visit (INDEPENDENT_AMBULATORY_CARE_PROVIDER_SITE_OTHER): Payer: BC Managed Care – PPO

## 2023-09-10 ENCOUNTER — Ambulatory Visit: Payer: BC Managed Care – PPO | Admitting: Nurse Practitioner

## 2023-09-10 ENCOUNTER — Encounter: Payer: Self-pay | Admitting: Nurse Practitioner

## 2023-09-10 VITALS — BP 142/84 | HR 89 | Temp 98.9°F | Ht 75.0 in | Wt 198.8 lb

## 2023-09-10 DIAGNOSIS — R509 Fever, unspecified: Secondary | ICD-10-CM | POA: Diagnosis not present

## 2023-09-10 DIAGNOSIS — Z1152 Encounter for screening for COVID-19: Secondary | ICD-10-CM

## 2023-09-10 DIAGNOSIS — R059 Cough, unspecified: Secondary | ICD-10-CM

## 2023-09-10 DIAGNOSIS — R6889 Other general symptoms and signs: Secondary | ICD-10-CM

## 2023-09-10 LAB — POCT INFLUENZA A/B
Influenza A, POC: NEGATIVE
Influenza B, POC: NEGATIVE

## 2023-09-10 LAB — POC COVID19 BINAXNOW: SARS Coronavirus 2 Ag: NEGATIVE

## 2023-09-10 MED ORDER — AZITHROMYCIN 250 MG PO TABS: ORAL_TABLET | ORAL | 0 refills | Status: AC

## 2023-09-10 NOTE — Patient Instructions (Signed)
Please go the lab for chest X-ray Rx sent to pharmacy

## 2023-09-10 NOTE — Progress Notes (Signed)
Established Patient Office Visit  Subjective:  Patient ID: Gary Mccullough, male    DOB: 1970/12/16  Age: 52 y.o. MRN: [id number]  CC:  Chief Complaint  Patient presents with   Cough    Fever, sore throat, right ear pain, pressure behind left eye and blowing blood out of nose    HPI  Gary Mccullough presents for:  Cough This is a new problem. The current episode started in the past 7 days. The problem has been gradually worsening. The cough is Productive of sputum and productive of blood-tinged sputum. Associated symptoms include ear congestion, a fever (101 on sunday), nasal congestion, postnasal drip and a sore throat. The symptoms are aggravated by lying down. There is no history of asthma or bronchitis.     Past Medical History:  Diagnosis Date   Alcohol abuse    Last Heavy Use 2014   Allergy    Cervical stenosis of spine    Chicken pox    Fatty liver    Liver hemangioma    Seizures (HCC)    Spondylisthesis     Past Surgical History:  Procedure Laterality Date   FINGER SURGERY  1980's   Thumb    FRACTURE SURGERY  1990   Nose   HERNIA REPAIR     [facility]  2013   [facility]    Family History  Problem Relation Age of Onset   CVA Father    Alcohol abuse Father    Gout Brother     Social History   Socioeconomic History   Marital status: Married    Spouse name: Bufford Lope   Number of children: Not on file   Years of education: Not on file   Highest education level: Not on file  Occupational History   Not on file  Tobacco Use   Smoking status: Former    Current packs/day: 0.00    Average packs/day: 1 pack/day for 19.8 years (19.8 ttl pk-yrs)    Types: Cigarettes    Start date: 08/02/1993    Quit date: 06/02/2013    Years since quitting: 10.3   Smokeless tobacco: Current   Tobacco comments:    Vape trying to quit on 09/10/23  Vaping Use   Vaping status: Every Day  Substance and Sexual Activity   Alcohol use: Not  Currently    Alcohol/week: 0.0 standard drinks of alcohol    Comment: Occasional   Drug use: No   Sexual activity: Yes    Partners: Female  Other Topics Concern   Not on file  Social History Narrative   Married to patient Levan Sooy    Social Drivers of Health   Financial Resource Strain: Not on file  Food Insecurity: Not on file  Transportation Needs: Not on file  Physical Activity: Not on file  Stress: Not on file  Social Connections: Not on file  Intimate Partner Violence: Not on file     Outpatient Medications Prior to Visit  Medication Sig Dispense Refill   aspirin 325 MG tablet Take 325 mg by mouth every 6 (six) hours as needed.      azithromycin (ZITHROMAX) 500 MG tablet Take 1 tablet (500 mg total) by mouth daily. 14 tablet 0   benzonatate (TESSALON) 100 MG capsule Take 1 capsule (100 mg total) by mouth every 8 (eight) hours. 21 capsule 0   cetirizine (ZYRTEC) 10 MG tablet Take 10 mg by mouth daily as needed.      famotidine (  PEPCID) 10 MG tablet Take 10 mg by mouth 2 (two) times daily.     fluticasone (FLONASE) 50 MCG/ACT nasal spray Place into both nostrils daily. AS NEEDED     fluticasone (FLONASE) 50 MCG/ACT nasal spray Place 2 sprays into both nostrils daily. 15.8 mL 5   Glucosamine-Chondroitin (GLUCOSAMINE CHONDR COMPLEX PO) Take 1 capsule by mouth daily.     lidocaine (XYLOCAINE) 2 % solution Use as directed 15 mLs in the mouth or throat as needed. 100 mL 0   Multiple Vitamin (MULTI-VITAMINS) TABS Take by mouth.     olopatadine (PATANOL) 0.1 % ophthalmic solution Place 1 drop into both eyes 2 (two) times daily. 5 mL 12   Omega-3 Fatty Acids (FISH OIL) 1000 MG CAPS Take by mouth.     promethazine-dextromethorphan (PROMETHAZINE-DM) 6.25-15 MG/5ML syrup Take 5 mLs by mouth at bedtime as needed for cough. 118 mL 0   tobramycin-dexamethasone (TOBRADEX) ophthalmic solution SMARTSIG:In Eye(s)     TURMERIC PO Take 2 capsules by mouth daily.     No  facility-administered medications prior to visit.    No Known Allergies  ROS Review of Systems  Constitutional:  Positive for fever (101 on sunday).  HENT:  Positive for congestion, postnasal drip and sore throat.   Respiratory:  Positive for cough.    Negative unless indicated in HPI.    Objective:    Physical Exam Constitutional:      Appearance: Normal appearance.  HENT:     Right Ear: Tympanic membrane normal. Tympanic membrane is not erythematous.     Left Ear: Tympanic membrane normal. Tympanic membrane is not erythematous.     Nose:     Right Turbinates: Not enlarged.     Left Turbinates: Not enlarged.     Right Sinus: No maxillary sinus tenderness or frontal sinus tenderness.     Left Sinus: No maxillary sinus tenderness or frontal sinus tenderness.     Mouth/Throat:     Mouth: Mucous membranes are moist.     Pharynx: Posterior oropharyngeal erythema present. No pharyngeal swelling or oropharyngeal exudate.     Tonsils: No tonsillar exudate.  Cardiovascular:     Rate and Rhythm: Normal rate and regular rhythm.  Pulmonary:     Effort: Pulmonary effort is normal.     Breath sounds: Normal breath sounds. No stridor. No wheezing.  Neurological:     General: No focal deficit present.     Mental Status: He is alert and oriented to person, place, and time. Mental status is at baseline.  Psychiatric:        Mood and Affect: Mood normal.        Behavior: Behavior normal.        Thought Content: Thought content normal.        Judgment: Judgment normal.     BP (!) 142/84   Pulse 89   Temp 98.9 F (37.2 C)   Ht 6\' 3"  (1.905 m)   Wt 198 lb 12.8 oz (90.2 kg)   SpO2 98%   BMI 24.85 kg/m  Wt Readings from Last 3 Encounters:  09/10/23 198 lb 12.8 oz (90.2 kg)  11/23/22 200 lb 12.8 oz (91.1 kg)  03/06/22 205 lb 9.6 oz (93.3 kg)     Health Maintenance  Topic Date Due   Zoster Vaccines- Shingrix (1 of 2) Never done   INFLUENZA VACCINE  Never done   COVID-19  Vaccine (2 - 2024-25 season) 06/02/2023   Colonoscopy  05/21/2024  DTaP/Tdap/Td (4 - Td or Tdap) 11/23/2031   Hepatitis C Screening  Completed   HIV Screening  Completed   HPV VACCINES  Aged Out    There are no preventive care reminders to display for this patient.  Lab Results  Component Value Date   TSH 1.02 11/23/2022   Lab Results  Component Value Date   WBC 5.4 11/23/2022   HGB 13.8 11/23/2022   HCT 40.0 11/23/2022   MCV 93.3 11/23/2022   PLT 356.0 11/23/2022   Lab Results  Component Value Date   NA 140 11/23/2022   K 4.5 11/23/2022   CO2 29 11/23/2022   GLUCOSE 88 11/23/2022   BUN 13 11/23/2022   CREATININE 0.83 11/23/2022   BILITOT 0.5 11/23/2022   ALKPHOS 61 11/23/2022   AST 31 11/23/2022   ALT 79 (H) 11/23/2022   PROT 7.1 11/23/2022   ALBUMIN 4.7 11/23/2022   CALCIUM 10.1 11/23/2022   ANIONGAP 7 05/02/2014   GFR 102.22 11/23/2022   Lab Results  Component Value Date   CHOL 162 11/23/2022   Lab Results  Component Value Date   HDL 54.70 11/23/2022   Lab Results  Component Value Date   LDLCALC 78 11/23/2022   Lab Results  Component Value Date   TRIG 147.0 11/23/2022   Lab Results  Component Value Date   CHOLHDL 3 11/23/2022   Lab Results  Component Value Date   HGBA1C 5.4 01/25/2016      Assessment & Plan:  Cough, unspecified type Assessment & Plan: Negative COVID and flu.  Given patient symptoms and fever will treat with azithromycin. Advised to continue to take Mucinex, Tessalon Perles. Increase fluid intake and rest Chest x-ray negative for any acute changes. Patient will let us know if symptoms not improving.  Orders: -     DG Chest 2 View -     Azithromycin; Take 2 tablets on day 1, then 1 tablet daily on days 2 through 5  Dispense: 6 tablet; Refill: 0  Flu-like symptoms -     POCT Influenza A/B  Encounter for screening for COVID-19 -     POC COVID-19 BinaxNow    Follow-up: No follow-ups on file.   Kara Dies,  NP

## 2023-09-22 DIAGNOSIS — R059 Cough, unspecified: Secondary | ICD-10-CM | POA: Insufficient documentation

## 2023-09-22 NOTE — Assessment & Plan Note (Signed)
Negative COVID and flu.  Given patient symptoms and fever will treat with azithromycin. Advised to continue to take Mucinex, Tessalon Perles. Increase fluid intake and rest Chest x-ray negative for any acute changes. Patient will let us know if symptoms not improving.

## 2023-11-25 ENCOUNTER — Encounter: Payer: Self-pay | Admitting: Internal Medicine

## 2023-11-25 ENCOUNTER — Ambulatory Visit (INDEPENDENT_AMBULATORY_CARE_PROVIDER_SITE_OTHER): Payer: BC Managed Care – PPO | Admitting: Internal Medicine

## 2023-11-25 DIAGNOSIS — Z1211 Encounter for screening for malignant neoplasm of colon: Secondary | ICD-10-CM

## 2023-11-25 DIAGNOSIS — Z125 Encounter for screening for malignant neoplasm of prostate: Secondary | ICD-10-CM

## 2023-11-25 DIAGNOSIS — Z0000 Encounter for general adult medical examination without abnormal findings: Secondary | ICD-10-CM

## 2023-11-25 DIAGNOSIS — R7301 Impaired fasting glucose: Secondary | ICD-10-CM | POA: Diagnosis not present

## 2023-11-25 DIAGNOSIS — R5383 Other fatigue: Secondary | ICD-10-CM | POA: Diagnosis not present

## 2023-11-25 DIAGNOSIS — I129 Hypertensive chronic kidney disease with stage 1 through stage 4 chronic kidney disease, or unspecified chronic kidney disease: Secondary | ICD-10-CM

## 2023-11-25 DIAGNOSIS — E785 Hyperlipidemia, unspecified: Secondary | ICD-10-CM

## 2023-11-25 LAB — HEMOGLOBIN A1C: Hgb A1c MFr Bld: 5.4 % (ref 4.6–6.5)

## 2023-11-25 LAB — TSH: TSH: 1.5 u[IU]/mL (ref 0.35–5.50)

## 2023-11-25 LAB — PSA: PSA: 0.29 ng/mL (ref 0.10–4.00)

## 2023-11-25 NOTE — Patient Instructions (Signed)
 Referral to [facility] for  screening colonoscopy has been done. (you are due in August)

## 2023-11-25 NOTE — Progress Notes (Unsigned)
 Patient ID: Gary Mccullough, male    DOB: August 15, 1971  Age: 53 y.o. MRN: [id number]  The patient is here for annual preventive examination and management of other chronic and acute problems.   The risk factors are reflected in the social history.   The roster of all physicians providing medical care to patient - is listed in the Snapshot section of the chart.   Activities of daily living:  The patient is 100% independent in all ADLs: dressing, toileting, feeding as well as independent mobility   Home safety : The patient has smoke detectors in the home. They wear seatbelts.  There are no unsecured firearms at home. There is no violence in the home.    There is no risks for hepatitis, STDs or HIV. There is no   history of blood transfusion. They have no travel history to infectious disease endemic areas of the world.   The patient has seen their dentist in the last six month. They have seen their eye doctor in the last year. The patinet  denies slight hearing difficulty with regard to whispered voices and some television programs.  They have deferred audiologic testing in the last year.  They do not  have excessive sun exposure. Discussed the need for sun protection: hats, long sleeves and use of sunscreen if there is significant sun exposure.    Diet: the importance of a healthy diet is discussed. They do have a healthy diet.   The benefits of regular aerobic exercise were discussed. The patient  exercises  3 to 5 days per week  for  60 minutes.    Depression screen: there are no signs or vegative symptoms of depression- irritability, change in appetite, anhedonia, sadness/tearfullness.   The following portions of the patient's history were reviewed and updated as appropriate: allergies, current medications, past family history, past medical history,  past surgical history, past social history  and problem list.   Visual acuity was not assessed per patient preference since the patient has  regular follow up with an  ophthalmologist. Hearing and body mass index were assessed and reviewed.    During the course of the visit the patient was educated and counseled about appropriate screening and preventive services including : fall prevention , diabetes screening, nutrition counseling, colorectal cancer screening, and recommended immunizations.    Chief Complaint:    Chronic low back pain , with occasional sciatica aggravated by physical activities.    Review of Symptoms  Patient denies headache, fevers, malaise, unintentional weight loss, skin rash, eye pain, sinus congestion and sinus pain, sore throat, dysphagia,  hemoptysis , cough, dyspnea, wheezing, chest pain, palpitations, orthopnea, edema, abdominal pain, nausea, melena, diarrhea, constipation, flank pain, dysuria, hematuria, urinary  Frequency, nocturia, numbness, tingling, seizures,  Focal weakness, Loss of consciousness,  Tremor, insomnia, depression, anxiety, and suicidal ideation.    Physical Exam:  There were no vitals taken for this visit.   Physical Exam Vitals reviewed.  Constitutional:      General: He is not in acute distress.    Appearance: Normal appearance. He is normal weight. He is not ill-appearing, toxic-appearing or diaphoretic.  HENT:     Head: Normocephalic and atraumatic.     Right Ear: Tympanic membrane, ear canal and external ear normal. There is no impacted cerumen.     Left Ear: Tympanic membrane, ear canal and external ear normal. There is no impacted cerumen.     Nose: Nose normal.     Mouth/Throat:  Mouth: Mucous membranes are moist.     Pharynx: Oropharynx is clear.  Eyes:     General: No scleral icterus.       Right eye: No discharge.        Left eye: No discharge.     Conjunctiva/sclera: Conjunctivae normal.  Neck:     Thyroid: No thyromegaly.     Vascular: No carotid bruit or JVD.  Cardiovascular:     Rate and Rhythm: Normal rate and regular rhythm.     Heart sounds:  Normal heart sounds.  Pulmonary:     Effort: Pulmonary effort is normal. No respiratory distress.     Breath sounds: Normal breath sounds.  Abdominal:     General: Bowel sounds are normal.     Palpations: Abdomen is soft. There is no mass.     Tenderness: There is no abdominal tenderness. There is no guarding or rebound.  Musculoskeletal:        General: Normal range of motion.     Cervical back: Normal range of motion and neck supple.  Lymphadenopathy:     Cervical: No cervical adenopathy.  Skin:    General: Skin is warm and dry.  Neurological:     General: No focal deficit present.     Mental Status: He is alert and oriented to person, place, and time. Mental status is at baseline.  Psychiatric:        Mood and Affect: Mood normal.        Behavior: Behavior normal.        Thought Content: Thought content normal.        Judgment: Judgment normal.    Assessment and Plan: Routine general medical examination at a health care facility Assessment & Plan: age appropriate education and counseling updated, referrals for preventative services and immunizations addressed, dietary and smoking counseling addressed, most recent labs reviewed.  I have personally reviewed and have noted:   1) the patient's medical and social history 2) The pt's use of alcohol, tobacco, and illicit drugs 3) The patient's current medications and supplements 4) Functional ability including ADL's, fall risk, home safety risk, hearing and visual impairment 5) Diet and physical activities 6) Evidence for depression or mood disorder 7) The patient's height, weight, and BMI have been recorded in the chart.     I have made referrals, and provided counseling and education based on review of the above    Hyperlipidemia, unspecified hyperlipidemia type -     Lipid panel -     LDL cholesterol, direct  Impaired fasting glucose -     Comprehensive metabolic panel -     Hemoglobin A1c -     Microalbumin /  creatinine urine ratio  Fatigue, unspecified type -     CBC with Differential/Platelet -     TSH  Prostate cancer screening -     PSA  Encounter for preventative adult health care examination  Colon cancer screening -     Ambulatory referral to Gastroenterology  Renal hypertension Assessment & Plan: Starting losartan for elevated microal/cr ratio . Rtc one week for bmet   Orders: -     Basic metabolic panel; Future  Other orders -     Losartan Potassium; Take 1 tablet (25 mg total) by mouth daily.  Dispense: 30 tablet; Refill: 0    No follow-ups on file.  Sherlene Shams, MD

## 2023-11-26 ENCOUNTER — Encounter: Payer: Self-pay | Admitting: Internal Medicine

## 2023-11-26 ENCOUNTER — Telehealth: Payer: Self-pay

## 2023-11-26 DIAGNOSIS — R809 Proteinuria, unspecified: Secondary | ICD-10-CM

## 2023-11-26 DIAGNOSIS — I129 Hypertensive chronic kidney disease with stage 1 through stage 4 chronic kidney disease, or unspecified chronic kidney disease: Secondary | ICD-10-CM | POA: Insufficient documentation

## 2023-11-26 LAB — CBC WITH DIFFERENTIAL/PLATELET
Basophils Absolute: 0 10*3/uL (ref 0.0–0.1)
Basophils Relative: 0.5 % (ref 0.0–3.0)
Eosinophils Absolute: 0.2 10*3/uL (ref 0.0–0.7)
Eosinophils Relative: 2.4 % (ref 0.0–5.0)
HCT: 41.2 % (ref 39.0–52.0)
Hemoglobin: 13.8 g/dL (ref 13.0–17.0)
Lymphocytes Relative: 44 % (ref 12.0–46.0)
Lymphs Abs: 3.2 10*3/uL (ref 0.7–4.0)
MCHC: 33.5 g/dL (ref 30.0–36.0)
MCV: 95.9 fl (ref 78.0–100.0)
Monocytes Absolute: 0.5 10*3/uL (ref 0.1–1.0)
Monocytes Relative: 7.6 % (ref 3.0–12.0)
Neutro Abs: 3.3 10*3/uL (ref 1.4–7.7)
Neutrophils Relative %: 45.5 % (ref 43.0–77.0)
Platelets: 306 10*3/uL (ref 150.0–400.0)
RBC: 4.29 Mil/uL (ref 4.22–5.81)
RDW: 12.6 % (ref 11.5–15.5)
WBC: 7.2 10*3/uL (ref 4.0–10.5)

## 2023-11-26 LAB — COMPREHENSIVE METABOLIC PANEL
ALT: 69 U/L — ABNORMAL HIGH (ref 0–53)
AST: 32 U/L (ref 0–37)
Albumin: 5.2 g/dL (ref 3.5–5.2)
Alkaline Phosphatase: 51 U/L (ref 39–117)
BUN: 13 mg/dL (ref 6–23)
CO2: 26 meq/L (ref 19–32)
Calcium: 9.8 mg/dL (ref 8.4–10.5)
Chloride: 102 meq/L (ref 96–112)
Creatinine, Ser: 1.07 mg/dL (ref 0.40–1.50)
GFR: 80.48 mL/min (ref >60.00)
Glucose, Bld: 86 mg/dL (ref 70–99)
Potassium: 4.6 meq/L (ref 3.5–5.1)
Sodium: 139 meq/L (ref 135–145)
Total Bilirubin: 0.3 mg/dL (ref 0.2–1.2)
Total Protein: 7.9 g/dL (ref 6.0–8.3)

## 2023-11-26 LAB — LIPID PANEL
Cholesterol: 205 mg/dL — ABNORMAL HIGH (ref 0–200)
HDL: 55.8 mg/dL (ref >39.00)
LDL Cholesterol: 113 mg/dL — ABNORMAL HIGH (ref 0–99)
NonHDL: 149.43
Total CHOL/HDL Ratio: 4
Triglycerides: 182 mg/dL — ABNORMAL HIGH (ref 0.0–149.0)
VLDL: 36.4 mg/dL (ref 0.0–40.0)

## 2023-11-26 LAB — MICROALBUMIN / CREATININE URINE RATIO
Creatinine,U: 22.7 mg/dL
Microalb Creat Ratio: 30.8 mg/g — ABNORMAL HIGH (ref 0.0–30.0)
Microalb, Ur: <0.7 mg/dL (ref 0.0–1.9)

## 2023-11-26 LAB — LDL CHOLESTEROL, DIRECT: Direct LDL: 138 mg/dL

## 2023-11-26 MED ORDER — LOSARTAN POTASSIUM 25 MG PO TABS: 25.0000 mg | ORAL_TABLET | Freq: Every day | ORAL | 0 refills | Status: DC

## 2023-11-26 NOTE — Assessment & Plan Note (Signed)
 Starting losartan for elevated microal/cr ratio . Rtc one week for bmet

## 2023-11-26 NOTE — Assessment & Plan Note (Signed)

## 2023-11-26 NOTE — Addendum Note (Signed)
 Addended by: Sherlene Shams on: 11/26/2023 01:07 PM   Modules accepted: Orders

## 2023-11-26 NOTE — Telephone Encounter (Signed)
 Colonoscopy not due until August 21st, 2025.  Informed patient that I am unable to schedule out that far but will send a reminder letter for him to call the office to schedule closer to August.  Thanks,  [place], [place]

## 2023-12-04 ENCOUNTER — Other Ambulatory Visit (INDEPENDENT_AMBULATORY_CARE_PROVIDER_SITE_OTHER): Payer: BC Managed Care – PPO

## 2023-12-04 DIAGNOSIS — R809 Proteinuria, unspecified: Secondary | ICD-10-CM | POA: Diagnosis not present

## 2023-12-04 DIAGNOSIS — I129 Hypertensive chronic kidney disease with stage 1 through stage 4 chronic kidney disease, or unspecified chronic kidney disease: Secondary | ICD-10-CM

## 2023-12-04 LAB — MICROALBUMIN / CREATININE URINE RATIO
Creatinine,U: 31.6 mg/dL
Microalb Creat Ratio: 22.2 mg/g (ref 0.0–30.0)
Microalb, Ur: <0.7 mg/dL (ref 0.0–1.9)

## 2023-12-05 ENCOUNTER — Encounter: Payer: Self-pay | Admitting: Internal Medicine

## 2023-12-05 LAB — BASIC METABOLIC PANEL
BUN: 14 mg/dL (ref 6–23)
CO2: 25 meq/L (ref 19–32)
Calcium: 10.1 mg/dL (ref 8.4–10.5)
Chloride: 102 meq/L (ref 96–112)
Creatinine, Ser: 0.86 mg/dL (ref 0.40–1.50)
GFR: 100.4 mL/min (ref >60.00)
Glucose, Bld: 90 mg/dL (ref 70–99)
Potassium: 4.2 meq/L (ref 3.5–5.1)
Sodium: 136 meq/L (ref 135–145)

## 2023-12-05 NOTE — Telephone Encounter (Signed)
 Spoke with pt and scheduled him for tomorrow at 11 am with Evelene Croon, NP.

## 2023-12-06 ENCOUNTER — Encounter: Payer: Self-pay | Admitting: Nurse Practitioner

## 2023-12-06 ENCOUNTER — Ambulatory Visit: Admitting: Nurse Practitioner

## 2023-12-06 VITALS — BP 126/78 | HR 80 | Temp 97.6°F | Ht 75.0 in | Wt 199.2 lb

## 2023-12-06 DIAGNOSIS — R000 Tachycardia, unspecified: Secondary | ICD-10-CM

## 2023-12-06 DIAGNOSIS — R0602 Shortness of breath: Secondary | ICD-10-CM | POA: Diagnosis not present

## 2023-12-06 NOTE — Progress Notes (Signed)
 Established Patient Office Visit  Subjective:  Patient ID: Gary Mccullough, male    DOB: 20-Oct-1970  Age: 53 y.o. MRN: [id number]  CC:  Chief Complaint  Patient presents with   Acute Visit    Elevated HR & pulse   Discussed the use of a AI scribe software for clinical note transcription with the patient, who gave verbal consent to proceed.  [name] HPI  Gary Mccullough presents for acute visit. He describes having  an episode of shortness of breath on 12/01/23  lasting about an hour while playing disc golf, which was a new experience for him. He did not experience palpitations but felt unable to catch his breath. Laying down for 15 minutes provided some relief, but the shortness of breath persisted for a while. He was able to check the BP during the episode and the reading was 176/82 and pulse 118.   He has not experienced any further episodes of shortness of breath since then. No recent cough, cold symptoms, or swelling in the legs. No palpitations, dizziness, chest pain or visual changes.  He has been recently started on losartan due to proteinuria and elevated BP.  He states after starting losartan he has been getting frequent headache and is taking Aspirin.  He reports feeling more fatigued earlier in the week but notes some improvement after being on losartan for five days. He has not been very active, staying mostly at home and not engaging in his usual activities like playing golf or working in the yard.  HPI   Past Medical History:  Diagnosis Date   Alcohol abuse    Last Heavy Use 2014   Allergy    Cervical stenosis of spine    Chicken pox    Fatty liver    Liver hemangioma    Seizures (HCC)    Spondylisthesis     Past Surgical History:  Procedure Laterality Date   FINGER SURGERY  1980's   Thumb    FRACTURE SURGERY  1990   Nose   HERNIA REPAIR     [facility]  2013   [facility]    Family History  Problem Relation Age of Onset    CVA Father    Alcohol abuse Father    Hypertension Brother    Gout Brother     Social History   Socioeconomic History   Marital status: Married    Spouse name: Bufford Lope   Number of children: Not on file   Years of education: Not on file   Highest education level: Not on file  Occupational History   Not on file  Tobacco Use   Smoking status: Former    Current packs/day: 0.00    Average packs/day: 1 pack/day for 19.8 years (19.8 ttl pk-yrs)    Types: Cigarettes    Start date: 08/02/1993    Quit date: 06/02/2013    Years since quitting: 10.5   Smokeless tobacco: Current   Tobacco comments:    Vape trying to quit on 09/10/23  Vaping Use   Vaping status: Every Day  Substance and Sexual Activity   Alcohol use: Not Currently    Alcohol/week: 0.0 standard drinks of alcohol    Comment: Occasional   Drug use: No   Sexual activity: Yes    Partners: Female  Other Topics Concern   Not on file  Social History Narrative   Married to patient Gary Mccullough    Social Drivers of Corporate investment banker  Strain: Not on file  Food Insecurity: Not on file  Transportation Needs: Not on file  Physical Activity: Not on file  Stress: Not on file  Social Connections: Not on file  Intimate Partner Violence: Not on file     Outpatient Medications Prior to Visit  Medication Sig Dispense Refill   aspirin 325 MG tablet Take 325 mg by mouth every 6 (six) hours as needed.      cetirizine (ZYRTEC) 10 MG tablet Take 10 mg by mouth daily as needed.      famotidine (PEPCID) 10 MG tablet Take 10 mg by mouth 2 (two) times daily.     fluticasone (FLONASE) 50 MCG/ACT nasal spray Place 2 sprays into both nostrils daily. 15.8 mL 5   Glucosamine-Chondroitin (GLUCOSAMINE CHONDR COMPLEX PO) Take 1 capsule by mouth daily.     losartan (COZAAR) 25 MG tablet Take 1 tablet (25 mg total) by mouth daily. 30 tablet 0   Multiple Vitamin (MULTI-VITAMINS) TABS Take by mouth.     Omega-3 Fatty Acids (FISH OIL) 1000  MG CAPS Take by mouth.     TURMERIC PO Take 2 capsules by mouth daily.     No facility-administered medications prior to visit.    No Known Allergies  ROS Review of Systems Negative unless indicated in HPI.    Objective:    Physical Exam Constitutional:      Appearance: Normal appearance.  Cardiovascular:     Rate and Rhythm: Normal rate and regular rhythm.     Pulses: Normal pulses.     Heart sounds: Normal heart sounds.  Pulmonary:     Effort: Pulmonary effort is normal.     Breath sounds: Normal breath sounds.  Musculoskeletal:     Cervical back: Normal range of motion.     Right lower leg: No edema.     Left lower leg: No edema.  Neurological:     General: No focal deficit present.     Mental Status: He is alert. Mental status is at baseline.  Psychiatric:        Mood and Affect: Mood normal.        Behavior: Behavior normal.        Thought Content: Thought content normal.        Judgment: Judgment normal.     BP 126/78   Pulse 80   Temp 97.6 F (36.4 C)   Ht 6\' 3"  (1.905 m)   Wt 199 lb 3.2 oz (90.4 kg)   SpO2 98%   BMI 24.90 kg/m  Wt Readings from Last 3 Encounters:  12/06/23 199 lb 3.2 oz (90.4 kg)  09/10/23 198 lb 12.8 oz (90.2 kg)  11/23/22 200 lb 12.8 oz (91.1 kg)     Health Maintenance  Topic Date Due   Zoster Vaccines- Shingrix (1 of 2) Never done   COVID-19 Vaccine (2 - 2024-25 season) 12/11/2023 (Originally 06/02/2023)   INFLUENZA VACCINE  12/30/2023 (Originally 05/02/2023)   Colonoscopy  05/21/2024   DTaP/Tdap/Td (4 - Td or Tdap) 11/23/2031   Hepatitis C Screening  Completed   HIV Screening  Completed   HPV VACCINES  Aged Out    There are no preventive care reminders to display for this patient.  Lab Results  Component Value Date   TSH 1.50 11/25/2023   Lab Results  Component Value Date   WBC 7.2 11/25/2023   HGB 13.8 11/25/2023   HCT 41.2 11/25/2023   MCV 95.9 11/25/2023   PLT 306.0 11/25/2023  Lab Results  Component Value  Date   NA 136 12/04/2023   K 4.2 12/04/2023   CO2 25 12/04/2023   GLUCOSE 90 12/04/2023   BUN 14 12/04/2023   CREATININE 0.86 12/04/2023   BILITOT 0.3 11/25/2023   ALKPHOS 51 11/25/2023   AST 32 11/25/2023   ALT 69 (H) 11/25/2023   PROT 7.9 11/25/2023   ALBUMIN 5.2 11/25/2023   CALCIUM 10.1 12/04/2023   ANIONGAP 7 05/02/2014   GFR 100.40 12/04/2023   Lab Results  Component Value Date   CHOL 205 (H) 11/25/2023   Lab Results  Component Value Date   HDL 55.80 11/25/2023   Lab Results  Component Value Date   LDLCALC 113 (H) 11/25/2023   Lab Results  Component Value Date   TRIG 182.0 (H) 11/25/2023   Lab Results  Component Value Date   CHOLHDL 4 11/25/2023   Lab Results  Component Value Date   HGBA1C 5.4 11/25/2023      Assessment & Plan:  SOB (shortness of breath) on exertion Assessment & Plan: One episode of SOB during exertion with elevated blood pressure. EKG normal. Denise palpitation or dizziness. No episode since then. - Consider cardiology referral for further evaluation - Advise hydration during physical activities.   Orders: -     EKG 12-Lead -     Ambulatory referral to Cardiology  Elevated pulse rate Assessment & Plan: Single episode of elevated pulse, Elevated blood pressure despite losartan, with possible losartan-induced headaches.  Monitoring needed to assess headache correlation with blood pressure or medication. -Continue home blood pressure monitoring and document readings. - Note headaches and check blood pressure during episodes to assess correlation. -Would consider medication change if headache persists on losartan. - Close follow up in 2 weeks with BP and pulse readings.     Follow-up: Return in about 2 weeks (around 12/20/2023) for hypertension PCO or me.   Kara Dies, NP

## 2023-12-06 NOTE — Assessment & Plan Note (Addendum)
 One episode of SOB during exertion with elevated blood pressure. EKG normal. Denise palpitation or dizziness. No episode since then. - Consider cardiology referral for further evaluation - Advise hydration during physical activities.

## 2023-12-09 ENCOUNTER — Encounter: Payer: Self-pay | Admitting: Nurse Practitioner

## 2023-12-09 DIAGNOSIS — R000 Tachycardia, unspecified: Secondary | ICD-10-CM | POA: Insufficient documentation

## 2023-12-09 DIAGNOSIS — D2262 Melanocytic nevi of left upper limb, including shoulder: Secondary | ICD-10-CM | POA: Diagnosis not present

## 2023-12-09 DIAGNOSIS — D2239 Melanocytic nevi of other parts of face: Secondary | ICD-10-CM | POA: Diagnosis not present

## 2023-12-09 DIAGNOSIS — D2261 Melanocytic nevi of right upper limb, including shoulder: Secondary | ICD-10-CM | POA: Diagnosis not present

## 2023-12-09 DIAGNOSIS — D225 Melanocytic nevi of trunk: Secondary | ICD-10-CM | POA: Diagnosis not present

## 2023-12-09 NOTE — Patient Instructions (Signed)
-  Please monitor blood pressure daily at same time and bring the log in 2 weeks follow up.

## 2023-12-09 NOTE — Assessment & Plan Note (Signed)
 Single episode of elevated pulse, Elevated blood pressure despite losartan, with possible losartan-induced headaches.  Monitoring needed to assess headache correlation with blood pressure or medication. -Continue home blood pressure monitoring and document readings. - Note headaches and check blood pressure during episodes to assess correlation. -Would consider medication change if headache persists on losartan. - Close follow up in 2 weeks with BP and pulse readings.

## 2023-12-23 ENCOUNTER — Ambulatory Visit: Admitting: Internal Medicine

## 2023-12-23 ENCOUNTER — Encounter: Payer: Self-pay | Admitting: Internal Medicine

## 2023-12-23 VITALS — BP 110/72 | HR 75 | Ht 75.0 in | Wt 199.2 lb

## 2023-12-23 DIAGNOSIS — F430 Acute stress reaction: Secondary | ICD-10-CM | POA: Diagnosis not present

## 2023-12-23 DIAGNOSIS — F410 Panic disorder [episodic paroxysmal anxiety] without agoraphobia: Secondary | ICD-10-CM

## 2023-12-23 DIAGNOSIS — R030 Elevated blood-pressure reading, without diagnosis of hypertension: Secondary | ICD-10-CM

## 2023-12-23 DIAGNOSIS — I129 Hypertensive chronic kidney disease with stage 1 through stage 4 chronic kidney disease, or unspecified chronic kidney disease: Secondary | ICD-10-CM | POA: Diagnosis not present

## 2023-12-23 MED ORDER — PROPRANOLOL HCL 10 MG PO TABS: 10.0000 mg | ORAL_TABLET | Freq: Three times a day (TID) | ORAL | 0 refills | Status: AC

## 2023-12-23 NOTE — Assessment & Plan Note (Addendum)
 Losartan not tolerated due to sedation; patient stopped it after one week.  READINGS for the last week without medication have been < 120/80  Lab Results  Component Value Date   MICROALBUR <0.7 12/04/2023   MICROALBUR <0.7 11/25/2023     Lab Results  Component Value Date   CREATININE 0.86 12/04/2023   Lab Results  Component Value Date   NA 136 12/04/2023   K 4.2 12/04/2023   CL 102 12/04/2023   CO2 25 12/04/2023

## 2023-12-23 NOTE — Assessment & Plan Note (Signed)
Propranolol prn

## 2023-12-23 NOTE — Patient Instructions (Signed)
 NO BLOOD PRESSURE MEDS ARE NEEDED AT THIS TIME  RETURN FOR LABS IN 3 MONTHS.  NO OFFICE VISIT NEEDED UNLESS YOUR HOME BP READINGS BECOME ELEVATED (140/90 OR HIGHER) OR THE LABS ARE ABNORMAL  MAKE SURE YOU ARE WELL HYDRATED AND RESTED ON THE DAY YOU COME FOR THE LABS   YOU CAN USE THE PROPRANOLOL FOR PANIC ATTACKS .  IT WILL DECREASE THE "POUNDING " OF YOUR HEART AND THE RATE

## 2023-12-23 NOTE — Progress Notes (Unsigned)
 Subjective:  Patient ID: Gary Mccullough, male    DOB: May 14, 1971  Age: 53 y.o. MRN: [id number]  CC: There were no encounter diagnoses.   HPI Gary Mccullough presents for  Chief Complaint  Patient presents with   Medical Management of Chronic Issues    2 week follow up on blood pressure    1) elevated blood pressure:  he was prescribed losartan and took it from March 3 to March  for elevated BP UaCR > 30 but did not tolerate medication. Tried  reducing dose for 3 more days,  still had headache so he stopped it on March 13.     Repeat UaCR done on March 5 was < 30  Has been monitoring  Pulse and BP at home and all readings have been <130/80   Has claustrophobia and panic attacks    Outpatient Medications Prior to Visit  Medication Sig Dispense Refill   aspirin 325 MG tablet Take 325 mg by mouth every 6 (six) hours as needed.      cetirizine (ZYRTEC) 10 MG tablet Take 10 mg by mouth daily as needed.      famotidine (PEPCID) 10 MG tablet Take 10 mg by mouth 2 (two) times daily.     fluticasone (FLONASE) 50 MCG/ACT nasal spray Place 2 sprays into both nostrils daily. 15.8 mL 5   Glucosamine-Chondroitin (GLUCOSAMINE CHONDR COMPLEX PO) Take 1 capsule by mouth daily.     Multiple Vitamin (MULTI-VITAMINS) TABS Take by mouth.     Omega-3 Fatty Acids (FISH OIL) 1000 MG CAPS Take by mouth.     TURMERIC PO Take 2 capsules by mouth daily.     losartan (COZAAR) 25 MG tablet Take 1 tablet (25 mg total) by mouth daily. (Patient not taking: Reported on 12/23/2023) 30 tablet 0   No facility-administered medications prior to visit.    Review of Systems;  Patient denies headache, fevers, malaise, unintentional weight loss, skin rash, eye pain, sinus congestion and sinus pain, sore throat, dysphagia,  hemoptysis , cough, dyspnea, wheezing, chest pain, palpitations, orthopnea, edema, abdominal pain, nausea, melena, diarrhea, constipation, flank pain, dysuria, hematuria, urinary  Frequency,  nocturia, numbness, tingling, seizures,  Focal weakness, Loss of consciousness,  Tremor, insomnia, depression, anxiety, and suicidal ideation.      Objective:  BP (!) 150/88   Pulse 75   Ht 6\' 3"  (1.905 m)   Wt 199 lb 3.2 oz (90.4 kg)   SpO2 98%   BMI 24.90 kg/m   BP Readings from Last 3 Encounters:  12/23/23 (!) 150/88  12/06/23 126/78  09/10/23 (!) 142/84    Wt Readings from Last 3 Encounters:  12/23/23 199 lb 3.2 oz (90.4 kg)  12/06/23 199 lb 3.2 oz (90.4 kg)  09/10/23 198 lb 12.8 oz (90.2 kg)    Physical Exam  Lab Results  Component Value Date   HGBA1C 5.4 11/25/2023   HGBA1C 5.4 01/25/2016    Lab Results  Component Value Date   CREATININE 0.86 12/04/2023   CREATININE 1.07 11/25/2023   CREATININE 0.83 11/23/2022    Lab Results  Component Value Date   WBC 7.2 11/25/2023   HGB 13.8 11/25/2023   HCT 41.2 11/25/2023   PLT 306.0 11/25/2023   GLUCOSE 90 12/04/2023   CHOL 205 (H) 11/25/2023   TRIG 182.0 (H) 11/25/2023   HDL 55.80 11/25/2023   LDLDIRECT 138.0 11/25/2023   LDLCALC 113 (H) 11/25/2023   ALT 69 (H) 11/25/2023   AST 32 11/25/2023  NA 136 12/04/2023   K 4.2 12/04/2023   CL 102 12/04/2023   CREATININE 0.86 12/04/2023   BUN 14 12/04/2023   CO2 25 12/04/2023   TSH 1.50 11/25/2023   PSA 0.29 11/25/2023   HGBA1C 5.4 11/25/2023   MICROALBUR <0.7 12/04/2023    No results found.  Assessment & Plan:  .There are no diagnoses linked to this encounter.   I spent 34 minutes on the day of this face to face encounter reviewing patient's  most recent visit with cardiology,  nephrology,  and neurology,  prior relevant surgical and non surgical procedures, recent  labs and imaging studies, counseling on weight management,  reviewing the assessment and plan with patient, and post visit ordering and reviewing of  diagnostics and therapeutics with patient  .   Follow-up: No follow-ups on file.   Sherlene Shams, MD

## 2023-12-23 NOTE — Assessment & Plan Note (Signed)
 Secondary to white coat hypertension He stopped losartan on March 13 due to persistent headache and low bp .  Home readings for the past 2 weeks are consistently < 130/80,  With 75% under 120/70.  No treatment indicated at this time .    Prn propranolol for panic attacks

## 2024-03-10 ENCOUNTER — Ambulatory Visit: Admitting: Cardiology

## 2024-03-24 ENCOUNTER — Other Ambulatory Visit (INDEPENDENT_AMBULATORY_CARE_PROVIDER_SITE_OTHER)

## 2024-03-24 DIAGNOSIS — R030 Elevated blood-pressure reading, without diagnosis of hypertension: Secondary | ICD-10-CM | POA: Diagnosis not present

## 2024-03-24 LAB — MICROALBUMIN / CREATININE URINE RATIO
Creatinine,U: 116.2 mg/dL
Microalb Creat Ratio: 8.8 mg/g (ref 0.0–30.0)
Microalb, Ur: 1 mg/dL (ref 0.0–1.9)

## 2024-03-25 LAB — BASIC METABOLIC PANEL WITH GFR
BUN: 14 mg/dL (ref 6–23)
CO2: 29 meq/L (ref 19–32)
Calcium: 10.1 mg/dL (ref 8.4–10.5)
Chloride: 102 meq/L (ref 96–112)
Creatinine, Ser: 1 mg/dL (ref 0.40–1.50)
GFR: 87.09 mL/min (ref >60.00)
Glucose, Bld: 112 mg/dL — ABNORMAL HIGH (ref 70–99)
Potassium: 4.6 meq/L (ref 3.5–5.1)
Sodium: 141 meq/L (ref 135–145)

## 2024-03-26 ENCOUNTER — Ambulatory Visit: Payer: Self-pay | Admitting: Internal Medicine

## 2024-11-03 ENCOUNTER — Telehealth: Payer: Self-pay

## 2024-11-03 ENCOUNTER — Encounter: Payer: Self-pay | Admitting: Internal Medicine

## 2024-11-03 ENCOUNTER — Ambulatory Visit: Admitting: Internal Medicine

## 2024-11-03 ENCOUNTER — Other Ambulatory Visit: Payer: Self-pay

## 2024-11-03 VITALS — BP 146/82 | HR 103 | Temp 97.7°F | Ht 75.0 in | Wt 192.0 lb

## 2024-11-03 DIAGNOSIS — I10 Essential (primary) hypertension: Secondary | ICD-10-CM

## 2024-11-03 DIAGNOSIS — R6889 Other general symptoms and signs: Secondary | ICD-10-CM

## 2024-11-03 DIAGNOSIS — E162 Hypoglycemia, unspecified: Secondary | ICD-10-CM

## 2024-11-03 DIAGNOSIS — R030 Elevated blood-pressure reading, without diagnosis of hypertension: Secondary | ICD-10-CM

## 2024-11-03 DIAGNOSIS — R252 Cramp and spasm: Secondary | ICD-10-CM

## 2024-11-03 DIAGNOSIS — R000 Tachycardia, unspecified: Secondary | ICD-10-CM

## 2024-11-03 DIAGNOSIS — I471 Supraventricular tachycardia, unspecified: Secondary | ICD-10-CM

## 2024-11-03 DIAGNOSIS — Z1211 Encounter for screening for malignant neoplasm of colon: Secondary | ICD-10-CM

## 2024-11-03 MED ORDER — NA SULFATE-K SULFATE-MG SULF 17.5-3.13-1.6 GM/177ML PO SOLN: 1.0000 | Freq: Once | ORAL | 0 refills | Status: AC

## 2024-11-03 MED ORDER — METOPROLOL TARTRATE 25 MG PO TABS: 50.0000 mg | ORAL_TABLET | Freq: Two times a day (BID) | ORAL | 2 refills | Status: AC

## 2024-11-03 NOTE — Telephone Encounter (Signed)
 Gastroenterology Pre-Procedure Review  Request Date: 11/20/24 Requesting Physician: Dr. Theophilus  PATIENT REVIEW QUESTIONS: The patient responded to the following health history questions as indicated:    1. Are you having any GI issues? yes (bubbly stomach for a few months now, offered an appt declined.  Advised if his symptoms get worse to call office to request an office visit.) 2. Do you have a personal history of Polyps? no 3. Do you have a family history of Colon Cancer or Polyps? no 4. Diabetes Mellitus? no 5. Joint replacements in the past 12 months?no 6. Major health problems in the past 3 months?no 7. Any artificial heart valves, MVP, or defibrillator?no    MEDICATIONS & ALLERGIES:    Patient reports the following regarding taking any anticoagulation/antiplatelet therapy:   Plavix, Coumadin, Eliquis, Xarelto, Lovenox, Pradaxa, Brilinta, or Effient? no Aspirin? yes (325mg  pt has been advised to decrease to 81mg  one week before procedure)  Patient confirms/reports the following medications:  Current Outpatient Medications  Medication Sig Dispense Refill   Na Sulfate-K Sulfate-Mg Sulfate concentrate (SUPREP) 17.5-3.13-1.6 GM/177ML SOLN Take 1 kit (354 mLs total) by mouth once for 1 dose. 354 mL 0   aspirin 325 MG tablet Take 325 mg by mouth every 6 (six) hours as needed.      cetirizine (ZYRTEC) 10 MG tablet Take 10 mg by mouth daily as needed.      famotidine (PEPCID) 10 MG tablet Take 10 mg by mouth 2 (two) times daily.     fluticasone  (FLONASE ) 50 MCG/ACT nasal spray Place 2 sprays into both nostrils daily. 15.8 mL 5   Glucosamine-Chondroitin (GLUCOSAMINE CHONDR COMPLEX PO) Take 1 capsule by mouth daily.     Multiple Vitamin (MULTI-VITAMINS) TABS Take by mouth.     Omega-3 Fatty Acids (FISH OIL) 1000 MG CAPS Take by mouth.     propranolol  (INDERAL ) 10 MG tablet Take 1 tablet (10 mg total) by mouth 3 (three) times daily. As needed for rapid heart rate 30 tablet 0   TURMERIC  PO Take 2 capsules by mouth daily.     No current facility-administered medications for this visit.    Patient confirms/reports the following allergies:  Allergies[1]  No orders of the defined types were placed in this encounter.   AUTHORIZATION INFORMATION Primary Insurance: 1D#: Group #:  Secondary Insurance: 1D#: Group #:  SCHEDULE INFORMATION: Date: 11/20/24 Time: Location: [facility]    [1]  Allergies Allergen Reactions   Losartan  Other (See Comments)    Headache , sleepiness

## 2024-11-03 NOTE — Patient Instructions (Signed)
 Please start taking metoprolol  25 mg twice daily.    You can add a 2nd dose if your BP is > 150 or pulse > 100,  but do not increase past  a total dose of 50 mg every 12 hours (4 tablets daily )

## 2024-11-04 ENCOUNTER — Emergency Department

## 2024-11-04 ENCOUNTER — Other Ambulatory Visit: Payer: Self-pay

## 2024-11-04 ENCOUNTER — Encounter: Payer: Self-pay | Admitting: Emergency Medicine

## 2024-11-04 ENCOUNTER — Emergency Department
Admission: EM | Admit: 2024-11-04 | Discharge: 2024-11-04 | Disposition: A | Discharge status: Home / Self Care | Attending: Emergency Medicine | Admitting: Emergency Medicine

## 2024-11-04 DIAGNOSIS — E162 Hypoglycemia, unspecified: Secondary | ICD-10-CM | POA: Insufficient documentation

## 2024-11-04 DIAGNOSIS — R1010 Upper abdominal pain, unspecified: Secondary | ICD-10-CM

## 2024-11-04 DIAGNOSIS — K760 Fatty (change of) liver, not elsewhere classified: Secondary | ICD-10-CM | POA: Insufficient documentation

## 2024-11-04 DIAGNOSIS — R079 Chest pain, unspecified: Secondary | ICD-10-CM

## 2024-11-04 DIAGNOSIS — K219 Gastro-esophageal reflux disease without esophagitis: Secondary | ICD-10-CM | POA: Insufficient documentation

## 2024-11-04 LAB — CBC
HCT: 43.2 % (ref 39.0–52.0)
Hemoglobin: 15.1 g/dL (ref 13.0–17.0)
MCH: 31.9 pg (ref 26.0–34.0)
MCHC: 35 g/dL (ref 30.0–36.0)
MCV: 91.1 fL (ref 80.0–100.0)
Platelets: 338 10*3/uL (ref 150–400)
RBC: 4.74 MIL/uL (ref 4.22–5.81)
RDW: 11.9 % (ref 11.5–15.5)
WBC: 8.1 10*3/uL (ref 4.0–10.5)
nRBC: 0 % (ref 0.0–0.2)

## 2024-11-04 LAB — MICROALBUMIN / CREATININE URINE RATIO
Creatinine,U: 138.1 mg/dL
Microalb Creat Ratio: 11.7 mg/g (ref 0.0–30.0)
Microalb, Ur: 1.6 mg/dL (ref 0.7–1.9)

## 2024-11-04 LAB — COMPREHENSIVE METABOLIC PANEL WITH GFR
ALT: 55 U/L — ABNORMAL HIGH (ref 3–53)
ALT: 64 U/L — ABNORMAL HIGH (ref 0–44)
AST: 28 U/L (ref 5–37)
AST: 31 U/L (ref 15–41)
Albumin: 5.5 g/dL — ABNORMAL HIGH (ref 3.5–5.2)
Albumin: 5.3 g/dL — ABNORMAL HIGH (ref 3.5–5.0)
Alkaline Phosphatase: 59 U/L (ref 39–117)
Alkaline Phosphatase: 70 U/L (ref 38–126)
Anion gap: 15 (ref 5–15)
BUN: 11 mg/dL (ref 6–23)
BUN: 11 mg/dL (ref 6–20)
CO2: 25 meq/L (ref 19–32)
CO2: 22 mmol/L (ref 22–32)
Calcium: 10.4 mg/dL (ref 8.4–10.5)
Calcium: 10 mg/dL (ref 8.9–10.3)
Chloride: 104 meq/L (ref 96–112)
Chloride: 101 mmol/L (ref 98–111)
Creatinine, Ser: 0.86 mg/dL (ref 0.40–1.50)
Creatinine, Ser: 0.86 mg/dL (ref 0.61–1.24)
GFR, Estimated: >60 mL/min
GFR: 99.76 mL/min
Glucose, Bld: 101 mg/dL — ABNORMAL HIGH (ref 70–99)
Glucose, Bld: 113 mg/dL — ABNORMAL HIGH (ref 70–99)
Potassium: 4.4 meq/L (ref 3.5–5.1)
Potassium: 4.1 mmol/L (ref 3.5–5.1)
Sodium: 139 meq/L (ref 135–145)
Sodium: 139 mmol/L (ref 135–145)
Total Bilirubin: 0.6 mg/dL (ref 0.2–1.2)
Total Bilirubin: 0.4 mg/dL (ref 0.0–1.2)
Total Protein: 8.3 g/dL (ref 6.0–8.3)
Total Protein: 8.5 g/dL — ABNORMAL HIGH (ref 6.5–8.1)

## 2024-11-04 LAB — URINALYSIS, ROUTINE W REFLEX MICROSCOPIC
Bacteria, UA: NONE SEEN
Bilirubin Urine: NEGATIVE
Bilirubin Urine: NEGATIVE
Glucose, UA: NEGATIVE mg/dL
Hgb urine dipstick: NEGATIVE
Hgb urine dipstick: NEGATIVE
Ketones, ur: NEGATIVE
Ketones, ur: NEGATIVE mg/dL
Leukocytes,Ua: NEGATIVE
Leukocytes,Ua: NEGATIVE
Nitrite: NEGATIVE
Nitrite: NEGATIVE
Protein, ur: NEGATIVE mg/dL
RBC / HPF: NONE SEEN
Specific Gravity, Urine: 1.02 (ref 1.000–1.030)
Specific Gravity, Urine: 1.011 (ref 1.005–1.030)
Squamous Epithelial / HPF: 0 /HPF (ref 0–5)
Total Protein, Urine: NEGATIVE
Urine Glucose: NEGATIVE
Urobilinogen, UA: 0.2 (ref 0.0–1.0)
pH: 5.5 (ref 5.0–8.0)
pH: 5 (ref 5.0–8.0)

## 2024-11-04 LAB — CBC WITH DIFFERENTIAL/PLATELET
Basophils Absolute: 0.1 10*3/uL (ref 0.0–0.1)
Basophils Relative: 0.6 % (ref 0.0–3.0)
Eosinophils Absolute: 0.1 10*3/uL (ref 0.0–0.7)
Eosinophils Relative: 1.1 % (ref 0.0–5.0)
HCT: 43.7 % (ref 39.0–52.0)
Hemoglobin: 15.1 g/dL (ref 13.0–17.0)
Lymphocytes Relative: 37.6 % (ref 12.0–46.0)
Lymphs Abs: 3 10*3/uL (ref 0.7–4.0)
MCHC: 34.6 g/dL (ref 30.0–36.0)
MCV: 92.9 fl (ref 78.0–100.0)
Monocytes Absolute: 0.6 10*3/uL (ref 0.1–1.0)
Monocytes Relative: 7.6 % (ref 3.0–12.0)
Neutro Abs: 4.2 10*3/uL (ref 1.4–7.7)
Neutrophils Relative %: 53.1 % (ref 43.0–77.0)
Platelets: 340 10*3/uL (ref 150.0–400.0)
RBC: 4.71 Mil/uL (ref 4.22–5.81)
RDW: 12.8 % (ref 11.5–15.5)
WBC: 7.9 10*3/uL (ref 4.0–10.5)

## 2024-11-04 LAB — MAGNESIUM: Magnesium: 2 mg/dL (ref 1.5–2.5)

## 2024-11-04 LAB — POCT GLUCOSE (DEVICE FOR HOME USE): POC Glucose: 110 mg/dL — AB (ref 70–99)

## 2024-11-04 LAB — TROPONIN T, HIGH SENSITIVITY: Troponin T High Sensitivity: <6 ng/L (ref 0–19)

## 2024-11-04 LAB — LIPASE, BLOOD: Lipase: 46 U/L (ref 11–51)

## 2024-11-04 LAB — HEMOGLOBIN A1C: Hgb A1c MFr Bld: 5.3 % (ref 4.6–6.5)

## 2024-11-04 LAB — CK: Total CK: 95 U/L (ref 17–232)

## 2024-11-04 MED ORDER — PANTOPRAZOLE SODIUM 40 MG PO TBEC
40.0000 mg | DELAYED_RELEASE_TABLET | Freq: Once | ORAL | Status: AC
  Administered 2024-11-04: 40 mg via ORAL
  Filled 2024-11-04: qty 1

## 2024-11-04 MED ORDER — LIDOCAINE VISCOUS HCL 2 % MT SOLN
15.0000 mL | Freq: Once | OROMUCOSAL | Status: AC
  Administered 2024-11-04: 15 mL via ORAL
  Filled 2024-11-04: qty 15

## 2024-11-04 MED ORDER — PANTOPRAZOLE SODIUM 20 MG PO TBEC: 20.0000 mg | DELAYED_RELEASE_TABLET | Freq: Every day | ORAL | 0 refills | Status: AC

## 2024-11-04 MED ORDER — ALUM & MAG HYDROXIDE-SIMETH 200-200-20 MG/5ML PO SUSP
30.0000 mL | Freq: Once | ORAL | Status: AC
  Administered 2024-11-04: 30 mL via ORAL
  Filled 2024-11-04: qty 30

## 2024-11-04 MED ORDER — SUCRALFATE 1 G PO TABS: 1.0000 g | ORAL_TABLET | Freq: Three times a day (TID) | ORAL | 0 refills | Status: AC | PRN

## 2024-11-04 NOTE — Discharge Instructions (Signed)
 Please avoid any acidic, fried, spicy foods until feeling a lot better.  Please make sure to follow-up with your primary care doctor as well as a gastroenterologist for further management of your acid reflux symptoms.    Return if you have any strokelike symptoms these include blurry vision, double vision, facial droop, slurred speech, if you have any weakness or numbness to 1 side of your body, if you have any crushing chest pain, difficulty breathing, severe headache, if you pass out, or if you have any additional concerns.

## 2024-11-04 NOTE — Assessment & Plan Note (Signed)
 Starting metoprolol  given concurrent episodes  of SVT

## 2024-11-04 NOTE — Assessment & Plan Note (Addendum)
 His recent episodes of sinus tach may have been triggered by dehydration or anxiety.  I have ordered and reviewed a 12 lead EKG and find that there are no acute changes and patient is in sinus rhythm.   Adding metoprolol  25 mg bid for control of blood pressure and tachycardia . Will consider order  ZIO monitor if SVT becomes recurrent

## 2024-11-04 NOTE — ED Triage Notes (Signed)
 Pt via POV from home. Pt c/o HTN, headache, R sided abd pain. Reports he has been having R sided abd pain since Friday. Also reports he was prescribed Metoprolol . Denies any NV. Pt is A&OX4 and NAD, ambulatory to triage.

## 2024-11-04 NOTE — ED Provider Notes (Signed)
 [facility] Provider Note    Event Date/Time   First MD Initiated Contact with Patient 11/04/24 2033     (approximate)   History   Hypertension   HPI  Gary Mccullough is a 54 y.o. male with history of smoking history, IBS, alcohol abuse, hypertension, presenting with high blood pressure and upper abdominal pain, worse of the right upper quadrant.  States been ongoing since Friday.  States that he was prescribed metoprolol  for high blood pressure and has taken 2 doses so far.  Does note some intermittent chest pain that he thinks could be related to the upper abdominal pain, states that sometimes is burning.  No nausea vomiting or diarrhea, does note a mild headache.  No shortness of breath or cough, no leg swelling.  No recent travel or surgeries, does not take any hormones, no history of DVT or malignancy.  States that is been at least 10 days since he last had alcohol.  On independent chart review, he was seen by primary care yesterday, was there for high blood pressure, systolic blood pressure was in the 150s.  Also noted diffuse abdominal pain but no chest pain.  Had diarrhea the day before.  Started metoprolol  given history of SVT.     Physical Exam   Triage Vital Signs: ED Triage Vitals  Encounter Vitals Group     BP 11/04/24 1604 (!) 142/103     Girls Systolic BP Percentile --      Girls Diastolic BP Percentile --      Boys Systolic BP Percentile --      Boys Diastolic BP Percentile --      Pulse Rate 11/04/24 1600 (!) (P) 112     Resp 11/04/24 1600 (P) 20     Temp 11/04/24 1600 (P) 97.9 F (36.6 C)     Temp Source 11/04/24 1600 (P) Oral     SpO2 11/04/24 1604 98 %     Weight 11/04/24 1602 192 lb (87.1 kg)     Height 11/04/24 1602 6' 3 (1.905 m)     Head Circumference --      Peak Flow --      Pain Score 11/04/24 1602 8     Pain Loc --      Pain Education --      Exclude from Growth Chart --     Most recent vital signs: Vitals:    11/04/24 1604 11/04/24 2136  BP: (!) 142/103 (!) 143/84  Pulse:  84  Resp:  20  Temp:  98 F (36.7 C)  SpO2: 98% 100%     General: Awake, no distress.  CV:  Good peripheral perfusion.  Resp:  Normal effort.  No tachypnea or respiratory distress Abd:  No distention.  Soft nontender Other:  No lower extremity edema, no unilateral calf swelling or tenderness, no focal weakness or numbness, no nuchal rigidity, he is nontoxic-appearing, pupils are equal, extraocular movements are intact, no facial droop or slurred speech.   ED Results / Procedures / Treatments   Labs (all labs ordered are listed, but only abnormal results are displayed) Labs Reviewed  COMPREHENSIVE METABOLIC PANEL WITH GFR - Abnormal; Notable for the following components:      Result Value   Glucose, Bld 113 (*)    Total Protein 8.5 (*)    Albumin 5.3 (*)    ALT 64 (*)    All other components within normal limits  LIPASE, BLOOD  CBC  URINALYSIS, ROUTINE W REFLEX MICROSCOPIC  TROPONIN T, HIGH SENSITIVITY     EKG  EKG shows, sinus tachycardia, rate 121, normal QRS, normal QTc, no obvious ischemic ST elevation,   RADIOLOGY On my independent interpretation, cxr without obvious consolidation   PROCEDURES:  Critical Care performed: No  Procedures   MEDICATIONS ORDERED IN ED: Medications  alum & mag hydroxide-simeth (MAALOX/MYLANTA) 200-200-20 MG/5ML suspension 30 mL (30 mLs Oral Given 11/04/24 2124)    And  lidocaine  (XYLOCAINE ) 2 % viscous mouth solution 15 mL (15 mLs Oral Given 11/04/24 2124)  pantoprazole  (PROTONIX ) EC tablet 40 mg (40 mg Oral Given 11/04/24 2124)     IMPRESSION / MDM / ASSESSMENT AND PLAN / ED COURSE  I reviewed the triage vital signs and the nursing notes.                              Differential diagnosis includes, but is not limited to, biliary colic, gastritis, GERD, acid reflux, ACS, dehydration, electrolyte derangements, early gastroenteritis.  Will get labs, EKG, troponin,  chest x-ray, right upper quadrant ultrasound.  GI cocktail.  P.o. fluids.  Reassess.  Patient's presentation is most consistent with acute presentation with potential threat to life or bodily function.  Independent interpretation of labs and imaging below.  Discussed with patient and wife about imaging and lab results including incidental findings of hepatic steatosis on ultrasound, he says that he is already aware that he has fatty liver.  Did discuss with him about taking medications as prescribed, avoiding any spicy, acidic, creamy or fried foods until he is feeling a lot better.  Will prescribe him some Protonix  as well as sucralfate  for home.  He states that he has an appointment with GI for colonoscopy soon, did discuss with him that if he is having persistent symptoms, but he should still follow-up with them.  Otherwise considered but no indication for inpatient admission at this time, he safe for outpatient management.  Will discharge with strict return precautions.    Clinical Course as of 11/04/24 2323  Wed Nov 04, 2024  2150 Independent review labs, electrolytes also really deranged, ALT is mildly elevated, rest of LFTs are normal.  Lipase is normal, no leukocytosis. [TT]  2150 DG Chest 1 View No acute chest findings.  [TT]  2314 US  ABDOMEN LIMITED RUQ (LIVER/GB) IMPRESSION: 1. No acute abnormality. 2. Hepatic steatosis.   [TT]    Clinical Course User Index [TT] Waymond Lorelle Cummins, MD     FINAL CLINICAL IMPRESSION(S) / ED DIAGNOSES   Final diagnoses:  Chest pain, unspecified type  Pain of upper abdomen  Gastroesophageal reflux disease, unspecified whether esophagitis present  Hepatic steatosis     Rx / DC Orders   ED Discharge Orders          Ordered    pantoprazole  (PROTONIX ) 20 MG tablet  Daily        11/04/24 2322    sucralfate  (CARAFATE ) 1 g tablet  Every 8 hours PRN        11/04/24 2322             Note:  This document was prepared using Dragon voice  recognition software and may include unintentional dictation errors.    Waymond Lorelle Cummins, MD 11/04/24 [phone]

## 2024-11-04 NOTE — Assessment & Plan Note (Signed)
[  Post prandial Reading of 69 by EMS was not repeated .  Today he has a normal CBG .  Chekcing A1c

## 2024-11-05 ENCOUNTER — Ambulatory Visit: Payer: Self-pay | Admitting: Internal Medicine

## 2024-11-05 LAB — URINE CULTURE

## 2024-11-20 ENCOUNTER — Ambulatory Visit: Admit: 2024-11-20

## 2024-11-27 ENCOUNTER — Encounter: Payer: BC Managed Care – PPO | Admitting: Internal Medicine
